# Patient Record
Sex: Male | Born: 1945 | Race: Black or African American | Hispanic: No | Marital: Single
Health system: Southern US, Community
[De-identification: ages and names within clinical notes are randomized; demographics above are authoritative.]

## PROBLEM LIST (undated history)

## (undated) DIAGNOSIS — N179 Acute kidney failure, unspecified: Secondary | ICD-10-CM

## (undated) DIAGNOSIS — J9621 Acute and chronic respiratory failure with hypoxia: Secondary | ICD-10-CM

## (undated) DIAGNOSIS — R6520 Severe sepsis without septic shock: Secondary | ICD-10-CM

## (undated) DIAGNOSIS — M4642 Discitis, unspecified, cervical region: Secondary | ICD-10-CM

## (undated) DIAGNOSIS — Z930 Tracheostomy status: Secondary | ICD-10-CM

## (undated) DIAGNOSIS — A419 Sepsis, unspecified organism: Secondary | ICD-10-CM

---

## 2011-10-28 DIAGNOSIS — E119 Type 2 diabetes mellitus without complications: Secondary | ICD-10-CM | POA: Insufficient documentation

## 2016-11-29 DIAGNOSIS — R918 Other nonspecific abnormal finding of lung field: Secondary | ICD-10-CM | POA: Insufficient documentation

## 2017-10-18 DIAGNOSIS — N400 Enlarged prostate without lower urinary tract symptoms: Secondary | ICD-10-CM | POA: Insufficient documentation

## 2019-02-19 DIAGNOSIS — N138 Other obstructive and reflux uropathy: Secondary | ICD-10-CM | POA: Insufficient documentation

## 2019-02-19 DIAGNOSIS — R296 Repeated falls: Secondary | ICD-10-CM | POA: Insufficient documentation

## 2019-02-19 DIAGNOSIS — N179 Acute kidney failure, unspecified: Secondary | ICD-10-CM | POA: Insufficient documentation

## 2019-02-19 DIAGNOSIS — D649 Anemia, unspecified: Secondary | ICD-10-CM | POA: Insufficient documentation

## 2019-02-19 DIAGNOSIS — R8281 Pyuria: Secondary | ICD-10-CM | POA: Insufficient documentation

## 2019-02-22 DIAGNOSIS — M4642 Discitis, unspecified, cervical region: Secondary | ICD-10-CM | POA: Diagnosis present

## 2019-02-26 DIAGNOSIS — J9602 Acute respiratory failure with hypercapnia: Secondary | ICD-10-CM | POA: Insufficient documentation

## 2019-02-26 DIAGNOSIS — J9601 Acute respiratory failure with hypoxia: Secondary | ICD-10-CM | POA: Insufficient documentation

## 2019-02-26 DIAGNOSIS — G9341 Metabolic encephalopathy: Secondary | ICD-10-CM | POA: Insufficient documentation

## 2019-03-04 DIAGNOSIS — J189 Pneumonia, unspecified organism: Secondary | ICD-10-CM | POA: Insufficient documentation

## 2019-03-09 DIAGNOSIS — N471 Phimosis: Secondary | ICD-10-CM | POA: Insufficient documentation

## 2019-03-09 DIAGNOSIS — F17210 Nicotine dependence, cigarettes, uncomplicated: Secondary | ICD-10-CM | POA: Insufficient documentation

## 2019-03-09 DIAGNOSIS — G8251 Quadriplegia, C1-C4 complete: Secondary | ICD-10-CM | POA: Insufficient documentation

## 2019-03-22 ENCOUNTER — Other Ambulatory Visit (HOSPITAL_COMMUNITY): Payer: Medicare HMO

## 2019-03-22 ENCOUNTER — Inpatient Hospital Stay
Admission: AD | Admit: 2019-03-22 | Discharge: 2019-04-10 | Disposition: A | Discharge status: Skilled Nursing Facility | Payer: Medicare HMO | Source: Other Acute Inpatient Hospital

## 2019-03-22 DIAGNOSIS — A419 Sepsis, unspecified organism: Secondary | ICD-10-CM | POA: Diagnosis present

## 2019-03-22 DIAGNOSIS — R509 Fever, unspecified: Secondary | ICD-10-CM

## 2019-03-22 DIAGNOSIS — M4642 Discitis, unspecified, cervical region: Secondary | ICD-10-CM | POA: Diagnosis present

## 2019-03-22 DIAGNOSIS — J9600 Acute respiratory failure, unspecified whether with hypoxia or hypercapnia: Secondary | ICD-10-CM

## 2019-03-22 DIAGNOSIS — N179 Acute kidney failure, unspecified: Secondary | ICD-10-CM | POA: Diagnosis present

## 2019-03-22 DIAGNOSIS — Z930 Tracheostomy status: Secondary | ICD-10-CM

## 2019-03-22 DIAGNOSIS — Z931 Gastrostomy status: Secondary | ICD-10-CM

## 2019-03-22 DIAGNOSIS — R6520 Severe sepsis without septic shock: Secondary | ICD-10-CM | POA: Diagnosis present

## 2019-03-22 DIAGNOSIS — J9621 Acute and chronic respiratory failure with hypoxia: Secondary | ICD-10-CM | POA: Diagnosis present

## 2019-03-22 HISTORY — DX: Acute and chronic respiratory failure with hypoxia: J96.21

## 2019-03-22 HISTORY — DX: Discitis, unspecified, cervical region: M46.42

## 2019-03-22 HISTORY — DX: Sepsis, unspecified organism: R65.20

## 2019-03-22 HISTORY — DX: Tracheostomy status: Z93.0

## 2019-03-22 HISTORY — DX: Sepsis, unspecified organism: A41.9

## 2019-03-22 HISTORY — DX: Acute kidney failure, unspecified: N17.9

## 2019-03-23 ENCOUNTER — Other Ambulatory Visit (HOSPITAL_COMMUNITY): Payer: Medicare HMO

## 2019-03-23 DIAGNOSIS — M4642 Discitis, unspecified, cervical region: Secondary | ICD-10-CM

## 2019-03-23 DIAGNOSIS — A419 Sepsis, unspecified organism: Secondary | ICD-10-CM

## 2019-03-23 DIAGNOSIS — J9621 Acute and chronic respiratory failure with hypoxia: Secondary | ICD-10-CM | POA: Diagnosis not present

## 2019-03-23 DIAGNOSIS — N179 Acute kidney failure, unspecified: Secondary | ICD-10-CM | POA: Diagnosis not present

## 2019-03-23 DIAGNOSIS — Z930 Tracheostomy status: Secondary | ICD-10-CM

## 2019-03-23 DIAGNOSIS — R6520 Severe sepsis without septic shock: Secondary | ICD-10-CM

## 2019-03-23 LAB — COMPREHENSIVE METABOLIC PANEL
ALT: 31 U/L (ref 0–44)
AST: 20 U/L (ref 15–41)
Albumin: 2.5 g/dL — ABNORMAL LOW (ref 3.5–5.0)
Alkaline Phosphatase: 65 U/L (ref 38–126)
Anion gap: 13 (ref 5–15)
BUN: 17 mg/dL (ref 8–23)
CO2: 26 mmol/L (ref 22–32)
Calcium: 9 mg/dL (ref 8.9–10.3)
Chloride: 98 mmol/L (ref 98–111)
Creatinine, Ser: 0.57 mg/dL — ABNORMAL LOW (ref 0.61–1.24)
GFR calc Af Amer: >60 mL/min (ref >60)
GFR calc non Af Amer: >60 mL/min (ref >60)
Glucose, Bld: 138 mg/dL — ABNORMAL HIGH (ref 70–99)
Potassium: 4.1 mmol/L (ref 3.5–5.1)
Sodium: 137 mmol/L (ref 135–145)
Total Bilirubin: 0.7 mg/dL (ref 0.3–1.2)
Total Protein: 6.2 g/dL — ABNORMAL LOW (ref 6.5–8.1)

## 2019-03-23 LAB — HEMOGLOBIN A1C
Hgb A1c MFr Bld: 6.1 % — ABNORMAL HIGH (ref 4.8–5.6)
Mean Plasma Glucose: 128.37 mg/dL

## 2019-03-23 LAB — CBC WITH DIFFERENTIAL/PLATELET
Abs Immature Granulocytes: 0.08 10*3/uL — ABNORMAL HIGH (ref 0.00–0.07)
Basophils Absolute: 0 10*3/uL (ref 0.0–0.1)
Basophils Relative: 0 %
Eosinophils Absolute: 0.7 10*3/uL — ABNORMAL HIGH (ref 0.0–0.5)
Eosinophils Relative: 9 %
HCT: 28.9 % — ABNORMAL LOW (ref 39.0–52.0)
Hemoglobin: 9.2 g/dL — ABNORMAL LOW (ref 13.0–17.0)
Immature Granulocytes: 1 %
Lymphocytes Relative: 14 %
Lymphs Abs: 1.1 10*3/uL (ref 0.7–4.0)
MCH: 23.8 pg — ABNORMAL LOW (ref 26.0–34.0)
MCHC: 31.8 g/dL (ref 30.0–36.0)
MCV: 74.9 fL — ABNORMAL LOW (ref 80.0–100.0)
Monocytes Absolute: 0.8 10*3/uL (ref 0.1–1.0)
Monocytes Relative: 9 %
Neutro Abs: 5.3 10*3/uL (ref 1.7–7.7)
Neutrophils Relative %: 67 %
Platelets: 288 10*3/uL (ref 150–400)
RBC: 3.86 MIL/uL — ABNORMAL LOW (ref 4.22–5.81)
RDW: 16.4 % — ABNORMAL HIGH (ref 11.5–15.5)
WBC: 8 10*3/uL (ref 4.0–10.5)
nRBC: 0 % (ref 0.0–0.2)

## 2019-03-23 LAB — MAGNESIUM: Magnesium: 1.8 mg/dL (ref 1.7–2.4)

## 2019-03-23 LAB — PROTIME-INR
INR: 1 (ref 0.8–1.2)
Prothrombin Time: 13.5 seconds (ref 11.4–15.2)

## 2019-03-23 LAB — PHOSPHORUS: Phosphorus: 4.5 mg/dL (ref 2.5–4.6)

## 2019-03-23 LAB — VANCOMYCIN, TROUGH
Vancomycin Tr: 27 ug/mL (ref 15–20)
Vancomycin Tr: 18 ug/mL (ref 15–20)

## 2019-03-23 MED ORDER — GENERIC EXTERNAL MEDICATION
Status: DC
Start: ? — End: 2019-03-23

## 2019-03-23 MED ORDER — SODIUM CHLORIDE 0.9 % IV SOLN
10.00 | INTRAVENOUS | Status: DC
Start: ? — End: 2019-03-23

## 2019-03-23 MED ORDER — CHLORHEXIDINE GLUCONATE 0.12 % MT SOLN
15.00 | OROMUCOSAL | Status: DC
Start: 2019-03-22 — End: 2019-03-23

## 2019-03-23 MED ORDER — DOCUSATE SODIUM 150 MG/15ML PO LIQD
100.00 | ORAL | Status: DC
Start: 2019-03-22 — End: 2019-03-23

## 2019-03-23 MED ORDER — IPRATROPIUM-ALBUTEROL 0.5-2.5 (3) MG/3ML IN SOLN
3.00 | RESPIRATORY_TRACT | Status: DC
Start: ? — End: 2019-03-23

## 2019-03-23 MED ORDER — FIRST-LANSOPRAZOLE 3 MG/ML PO SUSP
30.00 | ORAL | Status: DC
Start: 2019-03-23 — End: 2019-03-23

## 2019-03-23 MED ORDER — OXYCODONE HCL 5 MG PO TABS
5.00 | ORAL_TABLET | ORAL | Status: DC
Start: 2019-03-22 — End: 2019-03-23

## 2019-03-23 MED ORDER — GENERIC EXTERNAL MEDICATION
2.00 | Status: DC
Start: 2019-03-23 — End: 2019-03-23

## 2019-03-23 MED ORDER — GENERIC EXTERNAL MEDICATION
1.25 | Status: DC
Start: 2019-03-23 — End: 2019-03-23

## 2019-03-23 MED ORDER — SENNOSIDES 8.8 MG/5ML PO SYRP
10.00 | ORAL_SOLUTION | ORAL | Status: DC
Start: 2019-03-22 — End: 2019-03-23

## 2019-03-23 MED ORDER — CARBOXYMETHYLCELLULOSE SODIUM 1 % OP GEL
1.00 | OPHTHALMIC | Status: DC
Start: ? — End: 2019-03-23

## 2019-03-23 MED ORDER — HEPARIN SODIUM (PORCINE) 5000 UNIT/ML IJ SOLN
5000.00 | INTRAMUSCULAR | Status: DC
Start: 2019-03-22 — End: 2019-03-23

## 2019-03-23 MED ORDER — ONDANSETRON HCL 4 MG/2ML IJ SOLN
4.00 | INTRAMUSCULAR | Status: DC
Start: ? — End: 2019-03-23

## 2019-03-23 MED ORDER — TROPICAL LIQUID NUTRITION PO LIQD
5.00 | ORAL | Status: DC
Start: 2019-03-23 — End: 2019-03-23

## 2019-03-23 MED ORDER — MAGNESIUM OXIDE 400 MG PO TABS
800.00 | ORAL_TABLET | ORAL | Status: DC
Start: 2019-03-22 — End: 2019-03-23

## 2019-03-23 MED ORDER — MAGNESIUM HYDROXIDE 400 MG/5ML PO SUSP
15.00 | ORAL | Status: DC
Start: ? — End: 2019-03-23

## 2019-03-23 NOTE — Progress Notes (Signed)
Pulmonary [place]  Date of Service: 03/23/2019  PULMONARY CRITICAL CARE CONSULT   Jack Roberts  [id number]  DOB: 12-05-1945   DOA: 03/22/2019  Referring Physician: Merton Border, MD  HPI: Jack Roberts is a 74 y.o. male seen for follow up of Acute on Chronic Respiratory Failure.  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  Patient was apparently having some weakness going on for a week prior to admission he was having issues with falls and apparently fell on the day of admission.  In the ED he was noted to be afebrile and have mixed gram-positive flora in the urine.  He had a thorough neurological evaluation done CT cervical spine showed no acute abnormalities but the MRI that was done showed concern for discitis or osteomyelitis.  Patient also was noted to have other foci also that were noted at the time.  Patient was taken to the surgery had a C3-C4 laminectomies done cultures were collected at that time which were unremarkable.  Currently had a rapid response on January 31 with new onset of aphasia transferred to the ICU and actually was intubated at that time.  Review of Systems:  ROS performed and is unremarkable other than noted above.  Past Medical History:  Diagnosis Date  . Anemia  . COPD (chronic obstructive pulmonary disease) (*)  . Depression  . Diabetes mellitus (*)  Pre-Diabetes  . Essential hypertension  . Hypertension  . Obesity due to excess calories  . Prostate cancer ([place]) 10/27/2011  Sees urology at [facility] - no surgery, 2004  . PTSD (post-traumatic stress disorder)   Past Surgical History:  Procedure Laterality Date  . Posterior cervical laminectomy [id number]  [id number] Dr. Cathren Laine   No Known Allergies Infusions:  . fentaNYL 75 mcg/hr  (03/10/19 1312)  . NaCl Stopped (03/10/19 0912)    Medications: Reviewed on Rounds  Physical Exam:  Vitals: Temperature 98.7 pulse 107 respiratory rate 27 blood pressure is 122/69 saturations 100%  Ventilator Settings mode ventilation is spontaneous off ventilator on T collar currently on 28% FiO2  . General: Comfortable at this time . Eyes: Grossly normal lids, irises & conjunctiva . ENT: grossly tongue is normal . Neck: no obvious mass . Cardiovascular: S1-S2 normal no gallop or rub . Respiratory: No rhonchi no rales are noted . Abdomen: Soft and nontender at this time . Skin: no rash seen on limited exam . Musculoskeletal: not rigid . Psychiatric:unable to assess . Neurologic: no seizure no involuntary movements         Labs on Admission:  Basic Metabolic Panel: Recent Labs  Lab 03/23/19 0543  NA 137  K 4.1  CL 98  CO2 26  GLUCOSE 138*  BUN 17  CREATININE 0.57*  CALCIUM 9.0  MG 1.8  PHOS 4.5    No results for input(s): PHART, PCO2ART, PO2ART, HCO3, O2SAT in the last 168 hours.  Liver Function Tests: Recent Labs  Lab 03/23/19 0543  AST 20  ALT 31  ALKPHOS 65  BILITOT 0.7  PROT 6.2*  ALBUMIN 2.5*   No results for input(s): LIPASE, AMYLASE in the last 168 hours. No results for input(s): AMMONIA in the last 168 hours.  CBC: Recent Labs  Lab 03/23/19 0543  WBC 8.0  NEUTROABS 5.3  HGB 9.2*  HCT 28.9*  MCV 74.9*  PLT 288    Cardiac Enzymes: No  results for input(s): CKTOTAL, CKMB, CKMBINDEX, TROPONINI in the last 168 hours.  BNP (last 3 results) No results for input(s): BNP in the last 8760 hours.  ProBNP (last 3 results) No results for input(s): PROBNP in the last 8760 hours.   Radiological Exams on Admission: DG ABDOMEN PEG TUBE LOCATION  Result Date: 03/22/2019 CLINICAL DATA:  Peg tube positioning EXAM: ABDOMEN - 1 VIEW COMPARISON:  None. FINDINGS: Injection of contrast through the pre-existing PEG tube opacifies the stomach and the  proximal duodenum. The bowel gas pattern is nonobstructive. There is a large amount of stool in the colon. There is no obvious free air on this study. IMPRESSION: PEG tube in the stomach. Large amount of stool in the colon. No obvious free air on this study. Electronically Signed   By: Constance Holster M.D.   On: 03/22/2019 19:17   DG CHEST PORT 1 VIEW  Result Date: 03/23/2019 CLINICAL DATA:  Acute respiratory failure with tracheostomy EXAM: PORTABLE CHEST 1 VIEW COMPARISON:  None. FINDINGS: Tracheostomy tube is in place.  Left PICC with tip at the SVC. Low volume chest with hazy right and streaky left pulmonary opacity. No visible effusion. Normal heart size. IMPRESSION: 1. Unremarkable hardware positioning. 2. Low volume chest with hazy and streaky opacity attributed 2 atelectasis. Electronically Signed   By: Monte Fantasia M.D.   On: 03/23/2019 06:20    Assessment/Plan Active Problems:   Acute on chronic respiratory failure with hypoxia (HCC)   Acute renal failure (ARF) (HCC)   Discitis, unspecified, cervical region   Severe sepsis ([place])   Tracheostomy status ([place])   1. Acute on chronic respiratory failure with hypoxia patient right now is weaning on T collar has been on 28% FiO2 with good saturations.  Plan will be to continue with T collar trials continue secretion management pulmonary toilet. 2. Acute renal failure with hydronephrosis patient was treated for urinary tract infection and secondary to urinary retention.  We'll continue to follow along. 3. 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 4. Severe sepsis with shock treated patient has been on antibiotics infectious disease cultures will follow up with apparently the cultures were negative and there was some mild granulation tissue noted on the pathology at the time of surgery.  Patient is supposed to continue with  vancomycin and ceftriaxone for total of 6 weeks. 5. Tracheostomy status plan is to continue with full supportive care  I have personally seen and evaluated the patient, evaluated laboratory and imaging results, formulated the assessment and plan and placed orders. The Patient requires high complexity decision making with multiple systems involvement.  Case was discussed on Rounds with the Respiratory Therapy Director and the Respiratory staff Time Spent 4minutes  Jack Mcelroy A Inanna Telford, MD [facility] Pulmonary Critical Care Medicine Sleep Medicine

## 2019-03-24 DIAGNOSIS — Z930 Tracheostomy status: Secondary | ICD-10-CM | POA: Diagnosis not present

## 2019-03-24 DIAGNOSIS — A419 Sepsis, unspecified organism: Secondary | ICD-10-CM | POA: Diagnosis not present

## 2019-03-24 DIAGNOSIS — N179 Acute kidney failure, unspecified: Secondary | ICD-10-CM | POA: Diagnosis not present

## 2019-03-24 DIAGNOSIS — J9621 Acute and chronic respiratory failure with hypoxia: Secondary | ICD-10-CM | POA: Diagnosis not present

## 2019-03-24 NOTE — Progress Notes (Addendum)
Pulmonary Critical Care Medicine [place]   PULMONARY CRITICAL CARE SERVICE  PROGRESS NOTE  Date of Service: 03/24/2019  Jack Roberts  [id number]  DOB: 1945/12/21   DOA: 03/22/2019  Referring Physician: Merton Border, MD  HPI: Jack Roberts is a 74 y.o. male seen for follow up of Acute on Chronic Respiratory Failure.  Patient mains on 20% aerosol trach collar satting well no fever or distress.  Medications: Reviewed on Rounds  Physical Exam:  Vitals: Pulse 90 respirations 18 BP 151/75 O2 sat 100% temp 98.2  Ventilator Settings 20% aerosol trach collar  . General: Comfortable at this time . Eyes: Grossly normal lids, irises & conjunctiva . ENT: grossly tongue is normal . Neck: no obvious mass . Cardiovascular: S1 S2 normal no gallop . Respiratory: No rales or rhonchi noted . Abdomen: soft . Skin: no rash seen on limited exam . Musculoskeletal: not rigid . Psychiatric:unable to assess . Neurologic: no seizure no involuntary movements         Lab Data:   Basic Metabolic Panel: Recent Labs  Lab 03/23/19 0543  NA 137  K 4.1  CL 98  CO2 26  GLUCOSE 138*  BUN 17  CREATININE 0.57*  CALCIUM 9.0  MG 1.8  PHOS 4.5    ABG: No results for input(s): PHART, PCO2ART, PO2ART, HCO3, O2SAT in the last 168 hours.  Liver Function Tests: Recent Labs  Lab 03/23/19 0543  AST 20  ALT 31  ALKPHOS 65  BILITOT 0.7  PROT 6.2*  ALBUMIN 2.5*   No results for input(s): LIPASE, AMYLASE in the last 168 hours. No results for input(s): AMMONIA in the last 168 hours.  CBC: Recent Labs  Lab 03/23/19 0543  WBC 8.0  NEUTROABS 5.3  HGB 9.2*  HCT 28.9*  MCV 74.9*  PLT 288    Cardiac Enzymes: No results for input(s): CKTOTAL, CKMB, CKMBINDEX, TROPONINI in the last 168 hours.  BNP (last 3 results) No results for input(s): BNP in the last 8760 hours.  ProBNP (last 3 results) No results for input(s): PROBNP in the last 8760 hours.  Radiological  Exams: DG ABDOMEN PEG TUBE LOCATION  Result Date: 03/22/2019 CLINICAL DATA:  Peg tube positioning EXAM: ABDOMEN - 1 VIEW COMPARISON:  None. FINDINGS: Injection of contrast through the pre-existing PEG tube opacifies the stomach and the proximal duodenum. The bowel gas pattern is nonobstructive. There is a large amount of stool in the colon. There is no obvious free air on this study. IMPRESSION: PEG tube in the stomach. Large amount of stool in the colon. No obvious free air on this study. Electronically Signed   By: Constance Holster M.D.   On: 03/22/2019 19:17   DG CHEST PORT 1 VIEW  Result Date: 03/23/2019 CLINICAL DATA:  Acute respiratory failure with tracheostomy EXAM: PORTABLE CHEST 1 VIEW COMPARISON:  None. FINDINGS: Tracheostomy tube is in place.  Left PICC with tip at the SVC. Low volume chest with hazy right and streaky left pulmonary opacity. No visible effusion. Normal heart size. IMPRESSION: 1. Unremarkable hardware positioning. 2. Low volume chest with hazy and streaky opacity attributed 2 atelectasis. Electronically Signed   By: Monte Fantasia M.D.   On: 03/23/2019 06:20    Assessment/Plan Active Problems:   Acute on chronic respiratory failure with hypoxia (HCC)   Acute renal failure (ARF) (HCC)   Discitis, unspecified, cervical region   Severe sepsis ([place])   Tracheostomy status ([place])   1. Acute on chronic respiratory failure with  hypoxia patient is currently weaning on aerosol trach collar 28% FiO2.  We will continue to wean as tolerated continue supportive measures and pulmonary toilet. 2. Acute renal failure with hydronephrosis continue to follow along. 3. 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 4. Severe sepsis with shock treated with antibiotics per infectious disease cultures will follow.  Continue supportive measures. 5. Tracheostomy plan is to continue full supportive care.   I have personally seen and evaluated the patient,  evaluated laboratory and imaging results, formulated the assessment and plan and placed orders. The Patient requires high complexity decision making with multiple systems involvement.  Rounds were done with the Respiratory Therapy Director and Staff therapists and discussed with nursing staff also.  Allyne Gee, MD [facility] Pulmonary Critical Care Medicine Sleep Medicine

## 2019-03-25 DIAGNOSIS — N179 Acute kidney failure, unspecified: Secondary | ICD-10-CM | POA: Diagnosis not present

## 2019-03-25 DIAGNOSIS — A419 Sepsis, unspecified organism: Secondary | ICD-10-CM | POA: Diagnosis not present

## 2019-03-25 DIAGNOSIS — Z930 Tracheostomy status: Secondary | ICD-10-CM | POA: Diagnosis not present

## 2019-03-25 DIAGNOSIS — J9621 Acute and chronic respiratory failure with hypoxia: Secondary | ICD-10-CM | POA: Diagnosis not present

## 2019-03-25 LAB — VANCOMYCIN, TROUGH: Vancomycin Tr: 21 ug/mL (ref 15–20)

## 2019-03-25 LAB — PROTIME-INR
INR: 1 (ref 0.8–1.2)
Prothrombin Time: 13.1 seconds (ref 11.4–15.2)

## 2019-03-25 MED ORDER — GENERIC EXTERNAL MEDICATION
Status: DC
Start: ? — End: 2019-03-25

## 2019-03-25 NOTE — Progress Notes (Addendum)
Pulmonary Critical Care Medicine [place]   PULMONARY CRITICAL CARE SERVICE  PROGRESS NOTE  Date of Service: 03/25/2019  Claiborn Mccalip  [id number]  DOB: [birth year]   DOA: 03/22/2019  Referring Physician: Merton Border, MD  HPI: Jack Roberts is a 74 y.o. male seen for follow up of Acute on Chronic Respiratory Failure.  Patient mains on 20% aerosol trach collar using PMV without difficulty.  Satting well no fever distress.  Medications: Reviewed on Rounds  Physical Exam:  Vitals: Pulse 92 respirations 20 BP 112/60 O2 sat 99% temp 98.1  Ventilator Settings 20% ATC  . General: Comfortable at this time . Eyes: Grossly normal lids, irises & conjunctiva . ENT: grossly tongue is normal . Neck: no obvious mass . Cardiovascular: S1 S2 normal no gallop . Respiratory: No rales or rhonchi noted . Abdomen: soft . Skin: no rash seen on limited exam . Musculoskeletal: not rigid . Psychiatric:unable to assess . Neurologic: no seizure no involuntary movements         Lab Data:   Basic Metabolic Panel: Recent Labs  Lab 03/23/19 0543  NA 137  K 4.1  CL 98  CO2 26  GLUCOSE 138*  BUN 17  CREATININE 0.57*  CALCIUM 9.0  MG 1.8  PHOS 4.5    ABG: No results for input(s): PHART, PCO2ART, PO2ART, HCO3, O2SAT in the last 168 hours.  Liver Function Tests: Recent Labs  Lab 03/23/19 0543  AST 20  ALT 31  ALKPHOS 65  BILITOT 0.7  PROT 6.2*  ALBUMIN 2.5*   No results for input(s): LIPASE, AMYLASE in the last 168 hours. No results for input(s): AMMONIA in the last 168 hours.  CBC: Recent Labs  Lab 03/23/19 0543  WBC 8.0  NEUTROABS 5.3  HGB 9.2*  HCT 28.9*  MCV 74.9*  PLT 288    Cardiac Enzymes: No results for input(s): CKTOTAL, CKMB, CKMBINDEX, TROPONINI in the last 168 hours.  BNP (last 3 results) No results for input(s): BNP in the last 8760 hours.  ProBNP (last 3 results) No results for input(s): PROBNP in the last 8760  hours.  Radiological Exams: No results found.  Assessment/Plan Active Problems:   Acute on chronic respiratory failure with hypoxia (HCC)   Acute renal failure (ARF) (HCC)   Discitis, unspecified, cervical region   Severe sepsis ([place])   Tracheostomy status ([place])   1. Acute on chronic respiratory failure with hypoxia patient is currently weaning on aerosol trach collar 28% FiO2.  We will continue to wean as tolerated continue supportive measures and pulmonary toilet. 2. Acute renal failure with hydronephrosis continue to follow along. 3. 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 4. Severe sepsis with shock treated with antibiotics per infectious disease cultures will follow.  Continue supportive measures. 5. Tracheostomy plan is to continue full supportive care.   I have personally seen and evaluated the patient, evaluated laboratory and imaging results, formulated the assessment and plan and placed orders. The Patient requires high complexity decision making with multiple systems involvement.  Rounds were done with the Respiratory Therapy Director and Staff therapists and discussed with nursing staff also.  Allyne Gee, MD [facility] Pulmonary Critical Care Medicine Sleep Medicine

## 2019-03-26 ENCOUNTER — Other Ambulatory Visit (HOSPITAL_COMMUNITY): Payer: Medicare HMO

## 2019-03-26 DIAGNOSIS — A419 Sepsis, unspecified organism: Secondary | ICD-10-CM | POA: Diagnosis not present

## 2019-03-26 DIAGNOSIS — Z930 Tracheostomy status: Secondary | ICD-10-CM | POA: Diagnosis not present

## 2019-03-26 DIAGNOSIS — J9621 Acute and chronic respiratory failure with hypoxia: Secondary | ICD-10-CM | POA: Diagnosis not present

## 2019-03-26 DIAGNOSIS — N179 Acute kidney failure, unspecified: Secondary | ICD-10-CM | POA: Diagnosis not present

## 2019-03-26 MED ORDER — GENERIC EXTERNAL MEDICATION
Status: DC
Start: ? — End: 2019-03-26

## 2019-03-26 NOTE — Progress Notes (Signed)
Pulmonary Critical Care Medicine [place]   PULMONARY CRITICAL CARE SERVICE  PROGRESS NOTE  Date of Service: 03/26/2019  Jack Roberts  [id number]  DOB: 05/28/1945   DOA: 03/22/2019  Referring Physician: Merton Border, MD  HPI: Jack Roberts is a 74 y.o. male seen for follow up of Acute on Chronic Respiratory Failure.  Patient remains on T collar has been on 28% FiO2 using PMV also patient did have a fever noted today  Medications: Reviewed on Rounds  Physical Exam:  Vitals: Temperature is 99 pulse 102 respiratory rate 30 blood pressure is 123/73 saturations 100%  Ventilator Settings on T collar with an FiO2 of 28%  . General: Comfortable at this time . Eyes: Grossly normal lids, irises & conjunctiva . ENT: grossly tongue is normal . Neck: no obvious mass . Cardiovascular: S1 S2 normal no gallop . Respiratory: No rhonchi no rales are noted at this time . Abdomen: soft . Skin: no rash seen on limited exam . Musculoskeletal: not rigid . Psychiatric:unable to assess . Neurologic: no seizure no involuntary movements         Lab Data:   Basic Metabolic Panel: Recent Labs  Lab 03/23/19 0543  NA 137  K 4.1  CL 98  CO2 26  GLUCOSE 138*  BUN 17  CREATININE 0.57*  CALCIUM 9.0  MG 1.8  PHOS 4.5    ABG: No results for input(s): PHART, PCO2ART, PO2ART, HCO3, O2SAT in the last 168 hours.  Liver Function Tests: Recent Labs  Lab 03/23/19 0543  AST 20  ALT 31  ALKPHOS 65  BILITOT 0.7  PROT 6.2*  ALBUMIN 2.5*   No results for input(s): LIPASE, AMYLASE in the last 168 hours. No results for input(s): AMMONIA in the last 168 hours.  CBC: Recent Labs  Lab 03/23/19 0543  WBC 8.0  NEUTROABS 5.3  HGB 9.2*  HCT 28.9*  MCV 74.9*  PLT 288    Cardiac Enzymes: No results for input(s): CKTOTAL, CKMB, CKMBINDEX, TROPONINI in the last 168 hours.  BNP (last 3 results) No results for input(s): BNP in the last 8760 hours.  ProBNP (last 3  results) No results for input(s): PROBNP in the last 8760 hours.  Radiological Exams: DG Chest Port 1 View  Result Date: 03/26/2019 CLINICAL DATA:  Fever EXAM: PORTABLE CHEST 1 VIEW COMPARISON:  03/23/2019 chest radiograph FINDINGS: A tracheostomy tube and LEFT PICC line with tip overlying the mid SVC noted. RIGHT LOWER lung opacity/atelectasis is unchanged. LEFT mid lung atelectasis again noted. No pneumothorax or definite pleural effusion. No acute bony abnormalities are present. IMPRESSION: Unchanged RIGHT LOWER lung opacity/atelectasis and LEFT mid lung atelectasis. Electronically Signed   By: Margarette Canada M.D.   On: 03/26/2019 10:49    Assessment/Plan Active Problems:   Acute on chronic respiratory failure with hypoxia (HCC)   Acute renal failure (ARF) (HCC)   Discitis, unspecified, cervical region   Severe sepsis ([place])   Tracheostomy status ([place])   1. Acute on chronic respiratory failure hypoxia plan is to continue with T collar trials titrate oxygen continue pulmonary toilet 2. Acute renal failure at baseline following labs 3. Discitis patient does have infection which she was being treated for however now with temperature this will need to be reassessed. 4. Severe sepsis hemodynamics right now are stable patient does have fever will be treated 5. Tracheostomy remains in place tolerating PMV well   I have personally seen and evaluated the patient, evaluated laboratory and imaging results, formulated  the assessment and plan and placed orders. The Patient requires high complexity decision making with multiple systems involvement.  Rounds were done with the Respiratory Therapy Director and Staff therapists and discussed with nursing staff also.  Allyne Gee, MD [facility] Pulmonary Critical Care Medicine Sleep Medicine

## 2019-03-27 DIAGNOSIS — A419 Sepsis, unspecified organism: Secondary | ICD-10-CM | POA: Diagnosis not present

## 2019-03-27 DIAGNOSIS — Z930 Tracheostomy status: Secondary | ICD-10-CM | POA: Diagnosis not present

## 2019-03-27 DIAGNOSIS — J9621 Acute and chronic respiratory failure with hypoxia: Secondary | ICD-10-CM | POA: Diagnosis not present

## 2019-03-27 DIAGNOSIS — N179 Acute kidney failure, unspecified: Secondary | ICD-10-CM | POA: Diagnosis not present

## 2019-03-27 LAB — CBC
HCT: 32.6 % — ABNORMAL LOW (ref 39.0–52.0)
Hemoglobin: 9.9 g/dL — ABNORMAL LOW (ref 13.0–17.0)
MCH: 23.8 pg — ABNORMAL LOW (ref 26.0–34.0)
MCHC: 30.4 g/dL (ref 30.0–36.0)
MCV: 78.4 fL — ABNORMAL LOW (ref 80.0–100.0)
Platelets: 297 10*3/uL (ref 150–400)
RBC: 4.16 MIL/uL — ABNORMAL LOW (ref 4.22–5.81)
RDW: 16.5 % — ABNORMAL HIGH (ref 11.5–15.5)
WBC: 12.1 10*3/uL — ABNORMAL HIGH (ref 4.0–10.5)
nRBC: 0.2 % (ref 0.0–0.2)

## 2019-03-27 LAB — BASIC METABOLIC PANEL
Anion gap: 13 (ref 5–15)
BUN: 27 mg/dL — ABNORMAL HIGH (ref 8–23)
CO2: 26 mmol/L (ref 22–32)
Calcium: 8.9 mg/dL (ref 8.9–10.3)
Chloride: 97 mmol/L — ABNORMAL LOW (ref 98–111)
Creatinine, Ser: 1 mg/dL (ref 0.61–1.24)
GFR calc Af Amer: >60 mL/min (ref >60)
GFR calc non Af Amer: >60 mL/min (ref >60)
Glucose, Bld: 123 mg/dL — ABNORMAL HIGH (ref 70–99)
Potassium: 4.8 mmol/L (ref 3.5–5.1)
Sodium: 136 mmol/L (ref 135–145)

## 2019-03-27 LAB — URINE CULTURE: Culture: <10000 — AB

## 2019-03-27 LAB — VANCOMYCIN, TROUGH: Vancomycin Tr: 20 ug/mL (ref 15–20)

## 2019-03-27 LAB — MAGNESIUM: Magnesium: 2.2 mg/dL (ref 1.7–2.4)

## 2019-03-27 NOTE — Progress Notes (Signed)
Pulmonary Critical Care Medicine [place]   PULMONARY CRITICAL CARE SERVICE  PROGRESS NOTE  Date of Service: 03/27/2019  Aniseto Johnes  [id number]  DOB: 04/11/1945   DOA: 03/22/2019  Referring Physician: Merton Border, MD  HPI: Fiona Hogenson is a 74 y.o. male seen for follow up of Acute on Chronic Respiratory Failure.  Patient is on T collar on 28% FiO2 apparently still has a #8 tracheostomy in place  Medications: Reviewed on Rounds  Physical Exam:  Vitals: Temperature is 98.6 pulse 84 respiratory rate 18 blood pressure is 141/85 saturations 100%  Ventilator Settings on T collar FiO2 28%  . General: Comfortable at this time . Eyes: Grossly normal lids, irises & conjunctiva . ENT: grossly tongue is normal . Neck: no obvious mass . Cardiovascular: S1 S2 normal no gallop . Respiratory: No rhonchi no rales are noted . Abdomen: soft . Skin: no rash seen on limited exam . Musculoskeletal: not rigid . Psychiatric:unable to assess . Neurologic: no seizure no involuntary movements         Lab Data:   Basic Metabolic Panel: Recent Labs  Lab 03/23/19 0543 03/27/19 0421  NA 137 136  K 4.1 4.8  CL 98 97*  CO2 26 26  GLUCOSE 138* 123*  BUN 17 27*  CREATININE 0.57* 1.00  CALCIUM 9.0 8.9  MG 1.8 2.2  PHOS 4.5  --     ABG: No results for input(s): PHART, PCO2ART, PO2ART, HCO3, O2SAT in the last 168 hours.  Liver Function Tests: Recent Labs  Lab 03/23/19 0543  AST 20  ALT 31  ALKPHOS 65  BILITOT 0.7  PROT 6.2*  ALBUMIN 2.5*   No results for input(s): LIPASE, AMYLASE in the last 168 hours. No results for input(s): AMMONIA in the last 168 hours.  CBC: Recent Labs  Lab 03/23/19 0543 03/27/19 0421  WBC 8.0 12.1*  NEUTROABS 5.3  --   HGB 9.2* 9.9*  HCT 28.9* 32.6*  MCV 74.9* 78.4*  PLT 288 297    Cardiac Enzymes: No results for input(s): CKTOTAL, CKMB, CKMBINDEX, TROPONINI in the last 168 hours.  BNP (last 3 results) No results  for input(s): BNP in the last 8760 hours.  ProBNP (last 3 results) No results for input(s): PROBNP in the last 8760 hours.  Radiological Exams: DG Chest Port 1 View  Result Date: 03/26/2019 CLINICAL DATA:  Fever EXAM: PORTABLE CHEST 1 VIEW COMPARISON:  03/23/2019 chest radiograph FINDINGS: A tracheostomy tube and LEFT PICC line with tip overlying the mid SVC noted. RIGHT LOWER lung opacity/atelectasis is unchanged. LEFT mid lung atelectasis again noted. No pneumothorax or definite pleural effusion. No acute bony abnormalities are present. IMPRESSION: Unchanged RIGHT LOWER lung opacity/atelectasis and LEFT mid lung atelectasis. Electronically Signed   By: Margarette Canada M.D.   On: 03/26/2019 10:49    Assessment/Plan Active Problems:   Acute on chronic respiratory failure with hypoxia (HCC)   Acute renal failure (ARF) (HCC)   Discitis, unspecified, cervical region   Severe sepsis ([place])   Tracheostomy status ([place])   1. Acute on chronic respiratory failure with hypoxia plan is to continue with weaning protocol we will change the trach out to a #6 cuffless trach. 2. Acute renal failure continue to follow labs 3. Discitis treated 4. Severe sepsis resolving continue to monitor 5. Tracheostomy will be changed out today to a #6 cuffless   I have personally seen and evaluated the patient, evaluated laboratory and imaging results, formulated the assessment and plan  and placed orders. The Patient requires high complexity decision making with multiple systems involvement.  Rounds were done with the Respiratory Therapy Director and Staff therapists and discussed with nursing staff also.  Allyne Gee, MD [facility] Pulmonary Critical Care Medicine Sleep Medicine

## 2019-03-28 DIAGNOSIS — J9621 Acute and chronic respiratory failure with hypoxia: Secondary | ICD-10-CM | POA: Diagnosis not present

## 2019-03-28 DIAGNOSIS — Z930 Tracheostomy status: Secondary | ICD-10-CM | POA: Diagnosis not present

## 2019-03-28 DIAGNOSIS — A419 Sepsis, unspecified organism: Secondary | ICD-10-CM | POA: Diagnosis not present

## 2019-03-28 DIAGNOSIS — N179 Acute kidney failure, unspecified: Secondary | ICD-10-CM | POA: Diagnosis not present

## 2019-03-28 LAB — CULTURE, RESPIRATORY W GRAM STAIN: Culture: NORMAL

## 2019-03-28 MED ORDER — GENERIC EXTERNAL MEDICATION
Status: DC
Start: ? — End: 2019-03-28

## 2019-03-28 NOTE — Progress Notes (Signed)
Pulmonary Critical Care Medicine [place]   PULMONARY CRITICAL CARE SERVICE  PROGRESS NOTE  Date of Service: 03/28/2019  Jack Roberts  [id number]  DOB: 02/23/1945   DOA: 03/22/2019  Referring Physician: Merton Border, MD  HPI: Jack Roberts is a 74 y.o. male seen for follow up of Acute on Chronic Respiratory Failure.  Patient is on T collar currently on 28% FiO2 using PMV  Medications: Reviewed on Rounds  Physical Exam:  Vitals: Temperature is 99.0 pulse 98 respiratory rate 20 blood pressure is 112/65 saturations 97%  Ventilator Settings on T collar with an FiO2 of 28%  . General: Comfortable at this time . Eyes: Grossly normal lids, irises & conjunctiva . ENT: grossly tongue is normal . Neck: no obvious mass . Cardiovascular: S1 S2 normal no gallop . Respiratory: No rhonchi no rales are noted at this time . Abdomen: soft . Skin: no rash seen on limited exam . Musculoskeletal: not rigid . Psychiatric:unable to assess . Neurologic: no seizure no involuntary movements         Lab Data:   Basic Metabolic Panel: Recent Labs  Lab 03/23/19 0543 03/27/19 0421  NA 137 136  K 4.1 4.8  CL 98 97*  CO2 26 26  GLUCOSE 138* 123*  BUN 17 27*  CREATININE 0.57* 1.00  CALCIUM 9.0 8.9  MG 1.8 2.2  PHOS 4.5  --     ABG: No results for input(s): PHART, PCO2ART, PO2ART, HCO3, O2SAT in the last 168 hours.  Liver Function Tests: Recent Labs  Lab 03/23/19 0543  AST 20  ALT 31  ALKPHOS 65  BILITOT 0.7  PROT 6.2*  ALBUMIN 2.5*   No results for input(s): LIPASE, AMYLASE in the last 168 hours. No results for input(s): AMMONIA in the last 168 hours.  CBC: Recent Labs  Lab 03/23/19 0543 03/27/19 0421  WBC 8.0 12.1*  NEUTROABS 5.3  --   HGB 9.2* 9.9*  HCT 28.9* 32.6*  MCV 74.9* 78.4*  PLT 288 297    Cardiac Enzymes: No results for input(s): CKTOTAL, CKMB, CKMBINDEX, TROPONINI in the last 168 hours.  BNP (last 3 results) No results for  input(s): BNP in the last 8760 hours.  ProBNP (last 3 results) No results for input(s): PROBNP in the last 8760 hours.  Radiological Exams: DG Chest Port 1 View  Result Date: 03/26/2019 CLINICAL DATA:  Fever EXAM: PORTABLE CHEST 1 VIEW COMPARISON:  03/23/2019 chest radiograph FINDINGS: A tracheostomy tube and LEFT PICC line with tip overlying the mid SVC noted. RIGHT LOWER lung opacity/atelectasis is unchanged. LEFT mid lung atelectasis again noted. No pneumothorax or definite pleural effusion. No acute bony abnormalities are present. IMPRESSION: Unchanged RIGHT LOWER lung opacity/atelectasis and LEFT mid lung atelectasis. Electronically Signed   By: Margarette Canada M.D.   On: 03/26/2019 10:49    Assessment/Plan Active Problems:   Acute on chronic respiratory failure with hypoxia (HCC)   Acute renal failure (ARF) (HCC)   Discitis, unspecified, cervical region   Severe sepsis ([place])   Tracheostomy status ([place])   1. Acute on chronic respiratory failure with hypoxia continue with T collar trials patient is on 28% FiO2 last chest x-ray showing unchanged lower lobe atelectasis area 2. Acute renal failure no change 3. Discitis on antibiotics 4. Severe sepsis has been treated we will continue to follow 5. Tracheostomy remains in place right now   I have personally seen and evaluated the patient, evaluated laboratory and imaging results, formulated the assessment and  plan and placed orders. The Patient requires high complexity decision making with multiple systems involvement.  Rounds were done with the Respiratory Therapy Director and Staff therapists and discussed with nursing staff also.  Allyne Gee, MD [facility] Pulmonary Critical Care Medicine Sleep Medicine

## 2019-03-29 DIAGNOSIS — J9621 Acute and chronic respiratory failure with hypoxia: Secondary | ICD-10-CM | POA: Diagnosis not present

## 2019-03-29 DIAGNOSIS — A419 Sepsis, unspecified organism: Secondary | ICD-10-CM | POA: Diagnosis not present

## 2019-03-29 DIAGNOSIS — N179 Acute kidney failure, unspecified: Secondary | ICD-10-CM | POA: Diagnosis not present

## 2019-03-29 DIAGNOSIS — Z930 Tracheostomy status: Secondary | ICD-10-CM | POA: Diagnosis not present

## 2019-03-29 LAB — CBC
HCT: 28.5 % — ABNORMAL LOW (ref 39.0–52.0)
Hemoglobin: 8.6 g/dL — ABNORMAL LOW (ref 13.0–17.0)
MCH: 23.3 pg — ABNORMAL LOW (ref 26.0–34.0)
MCHC: 30.2 g/dL (ref 30.0–36.0)
MCV: 77.2 fL — ABNORMAL LOW (ref 80.0–100.0)
Platelets: 316 10*3/uL (ref 150–400)
RBC: 3.69 MIL/uL — ABNORMAL LOW (ref 4.22–5.81)
RDW: 15.9 % — ABNORMAL HIGH (ref 11.5–15.5)
WBC: 8.9 10*3/uL (ref 4.0–10.5)
nRBC: 0.2 % (ref 0.0–0.2)

## 2019-03-29 LAB — BASIC METABOLIC PANEL
Anion gap: 10 (ref 5–15)
BUN: 27 mg/dL — ABNORMAL HIGH (ref 8–23)
CO2: 30 mmol/L (ref 22–32)
Calcium: 8.8 mg/dL — ABNORMAL LOW (ref 8.9–10.3)
Chloride: 98 mmol/L (ref 98–111)
Creatinine, Ser: 1 mg/dL (ref 0.61–1.24)
GFR calc Af Amer: >60 mL/min (ref >60)
GFR calc non Af Amer: >60 mL/min (ref >60)
Glucose, Bld: 147 mg/dL — ABNORMAL HIGH (ref 70–99)
Potassium: 4.4 mmol/L (ref 3.5–5.1)
Sodium: 138 mmol/L (ref 135–145)

## 2019-03-29 LAB — MAGNESIUM: Magnesium: 2.4 mg/dL (ref 1.7–2.4)

## 2019-03-29 NOTE — Progress Notes (Signed)
Pulmonary Critical Care Medicine [place]   PULMONARY CRITICAL CARE SERVICE  PROGRESS NOTE  Date of Service: 03/29/2019  Jack Roberts  [id number]  DOB: 02/19/1945   DOA: 03/22/2019  Referring Physician: Merton Border, MD  HPI: Jack Roberts is a 74 y.o. male seen for follow up of Acute on Chronic Respiratory Failure.  Patient is on T collar now on 28% FiO2 with good saturations  Medications: Reviewed on Rounds  Physical Exam:  Vitals: Temperature 99.1 pulse 96 respiratory 20 blood pressure is 118/76 saturation 94%  Ventilator Settings off ventilator on T collar FiO2 28%  . General: Comfortable at this time . Eyes: Grossly normal lids, irises & conjunctiva . ENT: grossly tongue is normal . Neck: no obvious mass . Cardiovascular: S1 S2 normal no gallop . Respiratory: No rhonchi no rales are noted at this time . Abdomen: soft . Skin: no rash seen on limited exam . Musculoskeletal: not rigid . Psychiatric:unable to assess . Neurologic: no seizure no involuntary movements         Lab Data:   Basic Metabolic Panel: Recent Labs  Lab 03/23/19 0543 03/27/19 0421 03/29/19 0936  NA 137 136 138  K 4.1 4.8 4.4  CL 98 97* 98  CO2 26 26 30   GLUCOSE 138* 123* 147*  BUN 17 27* 27*  CREATININE 0.57* 1.00 1.00  CALCIUM 9.0 8.9 8.8*  MG 1.8 2.2 2.4  PHOS 4.5  --   --     ABG: No results for input(s): PHART, PCO2ART, PO2ART, HCO3, O2SAT in the last 168 hours.  Liver Function Tests: Recent Labs  Lab 03/23/19 0543  AST 20  ALT 31  ALKPHOS 65  BILITOT 0.7  PROT 6.2*  ALBUMIN 2.5*   No results for input(s): LIPASE, AMYLASE in the last 168 hours. No results for input(s): AMMONIA in the last 168 hours.  CBC: Recent Labs  Lab 03/23/19 0543 03/27/19 0421 03/29/19 0936  WBC 8.0 12.1* 8.9  NEUTROABS 5.3  --   --   HGB 9.2* 9.9* 8.6*  HCT 28.9* 32.6* 28.5*  MCV 74.9* 78.4* 77.2*  PLT 288 297 316    Cardiac Enzymes: No results for  input(s): CKTOTAL, CKMB, CKMBINDEX, TROPONINI in the last 168 hours.  BNP (last 3 results) No results for input(s): BNP in the last 8760 hours.  ProBNP (last 3 results) No results for input(s): PROBNP in the last 8760 hours.  Radiological Exams: No results found.  Assessment/Plan Active Problems:   Acute on chronic respiratory failure with hypoxia (HCC)   Acute renal failure (ARF) (HCC)   Discitis, unspecified, cervical region   Severe sepsis ([employer])   Tracheostomy status ([place])   1. Acute on chronic respiratory failure hypoxia on 28% FiO2 good saturations will try PMV 2. Acute renal failure following labs 3. Discitis treated antibiotics 4. Severe sepsis hemodynamics are stable 5. Tracheostomy remains in place   I have personally seen and evaluated the patient, evaluated laboratory and imaging results, formulated the assessment and plan and placed orders. The Patient requires high complexity decision making with multiple systems involvement.  Rounds were done with the Respiratory Therapy Director and Staff therapists and discussed with nursing staff also.  Allyne Gee, MD [facility] Pulmonary Critical Care Medicine Sleep Medicine

## 2019-03-30 DIAGNOSIS — N179 Acute kidney failure, unspecified: Secondary | ICD-10-CM | POA: Diagnosis not present

## 2019-03-30 DIAGNOSIS — J9621 Acute and chronic respiratory failure with hypoxia: Secondary | ICD-10-CM | POA: Diagnosis not present

## 2019-03-30 DIAGNOSIS — A419 Sepsis, unspecified organism: Secondary | ICD-10-CM | POA: Diagnosis not present

## 2019-03-30 DIAGNOSIS — Z930 Tracheostomy status: Secondary | ICD-10-CM | POA: Diagnosis not present

## 2019-03-30 MED ORDER — GENERIC EXTERNAL MEDICATION
Status: DC
Start: ? — End: 2019-03-30

## 2019-03-30 NOTE — Progress Notes (Signed)
Pulmonary Critical Care Medicine [place]   PULMONARY CRITICAL CARE SERVICE  PROGRESS NOTE  Date of Service: 03/30/2019  Jack Roberts  [id number]  DOB: [birth year]   DOA: 03/22/2019  Referring Physician: Merton Border, MD  HPI: Jack Roberts is a 74 y.o. male seen for follow up of Acute on Chronic Respiratory Failure.  Patient is on T collar now on 28% FiO2 using the PMV without any distress  Medications: Reviewed on Rounds  Physical Exam:  Vitals: Temperature is 98.1 pulse 83 respiratory 21 blood pressure is 149/66 saturations 98%  Ventilator Settings patient is on T collar currently on 28% FiO2  . General: Comfortable at this time . Eyes: Grossly normal lids, irises & conjunctiva . ENT: grossly tongue is normal . Neck: no obvious mass . Cardiovascular: S1 S2 normal no gallop . Respiratory: No rhonchi coarse breath sounds . Abdomen: soft . Skin: no rash seen on limited exam . Musculoskeletal: not rigid . Psychiatric:unable to assess . Neurologic: no seizure no involuntary movements         Lab Data:   Basic Metabolic Panel: Recent Labs  Lab 03/27/19 0421 03/29/19 0936  NA 136 138  K 4.8 4.4  CL 97* 98  CO2 26 30  GLUCOSE 123* 147*  BUN 27* 27*  CREATININE 1.00 1.00  CALCIUM 8.9 8.8*  MG 2.2 2.4    ABG: No results for input(s): PHART, PCO2ART, PO2ART, HCO3, O2SAT in the last 168 hours.  Liver Function Tests: No results for input(s): AST, ALT, ALKPHOS, BILITOT, PROT, ALBUMIN in the last 168 hours. No results for input(s): LIPASE, AMYLASE in the last 168 hours. No results for input(s): AMMONIA in the last 168 hours.  CBC: Recent Labs  Lab 03/27/19 0421 03/29/19 0936  WBC 12.1* 8.9  HGB 9.9* 8.6*  HCT 32.6* 28.5*  MCV 78.4* 77.2*  PLT 297 316    Cardiac Enzymes: No results for input(s): CKTOTAL, CKMB, CKMBINDEX, TROPONINI in the last 168 hours.  BNP (last 3 results) No results for input(s): BNP in the last 8760  hours.  ProBNP (last 3 results) No results for input(s): PROBNP in the last 8760 hours.  Radiological Exams: No results found.  Assessment/Plan Active Problems:   Acute on chronic respiratory failure with hypoxia (HCC)   Acute renal failure (ARF) (HCC)   Discitis, unspecified, cervical region   Severe sepsis ([place])   Tracheostomy status ([place])   1. Acute on chronic respiratory failure with hypoxia patient currently will be weaned on T collar 2. Use the PMV reportedly secretions are still an issue 3. Acute renal failure monitoring labs 4. Discitis treated antibiotics 5. Severe sepsis we will continue supportive care 6. Tracheostomy remains in place   I have personally seen and evaluated the patient, evaluated laboratory and imaging results, formulated the assessment and plan and placed orders. The Patient requires high complexity decision making with multiple systems involvement.  Rounds were done with the Respiratory Therapy Director and Staff therapists and discussed with nursing staff also.  Allyne Gee, MD [facility] Pulmonary Critical Care Medicine Sleep Medicine

## 2019-03-30 NOTE — Consult Note (Signed)
Infectious Disease Consultation   Danelle Sandstrom  [id number]  DOB: 1945-04-24  DOA: 03/22/2019  Requesting physician: Dr.Hijazi  Reason for consultation: Antibiotic recommendations   History of Present Illness: Jack Roberts is an 74 y.o. male with multiple medical problems, COPD, chronic respiratory failure, tracheostomy dependent, hypertension, diabetes mellitus, depression/PTSD who was apparently found down and admitted at the acute facility for generalized weakness.  He had an MRI and was diagnosed with discitis/osteomyelitis along with urinary tract infection.  Per infectious disease note from the outside facility UA showed greater than 100 WBC with culture mixed gram-positive flora.  CT scan showed bilateral hydroureteronephrosis without obstructing stone or mass likely secondary to bladder distention.  His Covid testing was negative.  Blood cultures did not show any growth. patient hospital course was complicated by worsening respiratory failure and required intubation and ventilation and eventually tracheostomy and PEG tube placement. Per records from acute facility MRI of the spine showed findings concerning for discitis/osteomyelitis at C3-4 with severe narrowing of the thecal sac and concern for cord compression with likely epidural phlegmon-neoplasm also on the differential. There was another level of increased focus nondiagnostic due to motion at T9-T10 with follow-up recommended if clinically indicated. 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 He underwent laminectomies at C3/4 for epidural mass considered to be phlegmon.  He received treatment with vancomycin, ceftriaxone, cefepime, meropenem, Flagyl. Intraoperative cultures reportedly did not show any growth likely because he was on antibiotics prior to surgery. He was switched to IV vancomycin, ceftriaxone per infectious disease recommendations at the outside facility.  Plan to  treat for a total duration of 6 weeks.  Review of Systems:  He is lethargic but arousable, moaning and groaning but nonverbal at this time.  Unable to obtain review of systems.   Past Medical History: Past Medical History:  Diagnosis Date  . Acute on chronic respiratory failure with hypoxia ([place])   . Acute renal failure (ARF) ([place])   . Discitis, unspecified, cervical region   . Severe sepsis ([place])   . Tracheostomy status (HCC)   COPD, hypertension, diabetes mellitus  Past Surgical History: Unable to obtain at this time  Allergies: No known drug allergies  Social History: Smoking status: Current Every Day Smoker  Packs/day: 0.50  Years: 20.00  Pack years: 10.00  . Smokeless tobacco: Never Used  Substance and Sexual Activity  . Alcohol use: Not Currently  . Drug use: No    Family History: . Hypertension Mother  . Depression Mother  . Hypertension Father  . Prostate cancer Neg Hx     Physical Exam: Temperature 98.9, T-max 100, pulse 103, respiratory 23, blood pressure 144/76, pulse oximetry 95% Constitutional: Ill-appearing male, lethargic but arousable, moaning and groaning Eyes: PERLA ENMT: external ears and nose appear normal, normal hearing, moist oral mucosa             Neck: Has trach in place CVS: S1-S2 Respiratory: Decreased breath sound lower lobes, scattered rhonchi, no wheezing Abdomen: soft nontender, positive bowel sounds Musculoskeletal: Quadriplegia, lower extremity edema Neuro: Quadriplegia Psych: Lethargic but arousable.  Unable to assess at this time Skin: No new rashes  Data reviewed:  I have personally reviewed following labs and imaging studies Labs:  CBC: Recent Labs  Lab 03/27/19 0421 03/29/19 0936  WBC 12.1* 8.9  HGB 9.9* 8.6*  HCT 32.6* 28.5*  MCV 78.4* 77.2*  PLT 297 316  Basic Metabolic Panel: Recent Labs  Lab 03/27/19 0421 03/29/19 0936  NA 136 138  K 4.8 4.4  CL 97* 98  CO2 26 30  GLUCOSE 123* 147*  BUN 27* 27*   CREATININE 1.00 1.00  CALCIUM 8.9 8.8*  MG 2.2 2.4   GFR CrCl cannot be calculated (Unknown ideal weight.). Liver Function Tests: No results for input(s): AST, ALT, ALKPHOS, BILITOT, PROT, ALBUMIN in the last 168 hours. No results for input(s): LIPASE, AMYLASE in the last 168 hours. No results for input(s): AMMONIA in the last 168 hours. Coagulation profile Recent Labs  Lab 03/25/19 2132  INR 1.0    Cardiac Enzymes: No results for input(s): CKTOTAL, CKMB, CKMBINDEX, TROPONINI in the last 168 hours. BNP: Invalid input(s): POCBNP CBG: No results for input(s): GLUCAP in the last 168 hours. D-Dimer No results for input(s): DDIMER in the last 72 hours. Hgb A1c No results for input(s): HGBA1C in the last 72 hours. Lipid Profile No results for input(s): CHOL, HDL, LDLCALC, TRIG, CHOLHDL, LDLDIRECT in the last 72 hours. Thyroid function studies No results for input(s): TSH, T4TOTAL, T3FREE, THYROIDAB in the last 72 hours.  Invalid input(s): FREET3 Anemia work up No results for input(s): VITAMINB12, FOLATE, FERRITIN, TIBC, IRON, RETICCTPCT in the last 72 hours. Urinalysis No results found for: COLORURINE, APPEARANCEUR, [place], [place], [place], [place], [place], [place], [place], UROBILINOGEN, NITRITE, [place]   Microbiology Recent Results (from the past 240 hour(s))  Culture, respiratory (non-expectorated)     Status: None   Collection Time: 03/26/19  9:48 AM   Specimen: Tracheal Aspirate; Respiratory  Result Value Ref Range Status   Specimen Description TRACHEAL ASPIRATE  Final   Special Requests NONE  Final   Gram Stain   Final    FEW WBC PRESENT, PREDOMINANTLY PMN NO ORGANISMS SEEN    Culture   Final    Consistent with normal respiratory flora. Performed at [facility], [place] [address], [place], [place] [address]    Report Status 03/28/2019 FINAL  Final  Culture, Urine     Status: Abnormal   Collection Time: 03/26/19  6:30 PM   Specimen:  Urine, Random  Result Value Ref Range Status   Specimen Description URINE, RANDOM  Final   Special Requests NONE  Final   Culture (A)  Final    <10,000 COLONIES/mL INSIGNIFICANT GROWTH Performed at [facility] [address], [place], [place] [address]    Report Status 03/27/2019 FINAL  Final       Inpatient Medications:   Please see MAR   Radiological Exams on Admission: No results found.  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  He was followed by pulmonary.  Patient is a longtime smoker and likely has underlying COPD.  Currently he is trach dependent.  Due to his chronic trach is high risk for recurrent tracheobronchitis, recurrent pneumonia.  If his rates are status worsens would recommend to repeat chest imaging and send for respiratory cultures.  Currently on treatment with IV vancomycin, ceftriaxone for the C3-C4 discitis/osteomyelitis.  C3-C4 discitis/osteomyelitis: Per MRI at the outside facility he had a fluid collection with severe cord compression.  He  is status post laminectomy and decompression.  Intraoperative cultures did not show any growth likely because he was already on antibiotics prior to the surgery.  Currently on treatment with IV vancomycin, ceftriaxone.  Plan to treat for total duration of 6 weeks with tentative end date 04/19/2019.  Please monitor BUN/trending closely while on antibiotics and adjust dose accordingly.  Severe sepsis: Likely secondary to urinary tract infection along  with C3-C4 discitis/osteomyelitis.  Currently sepsis resolved.  However, he is at high risk for recurrent sepsis.  If he starts having any worsening fevers or worsening WBC count would recommend to send for pan cultures.  Central cord compression: The status post laminectomy and decompression.  Unfortunately quadriplegic.  Continue supportive management per primary team.  UTI: Patient reportedly had urinary retention with bilateral hydronephrosis likely secondary to bladder distention without any obstruction.  His UA showed evidence of UTI but urine culture showed mixed flora.  Already treated with antibiotics at the acute facility.  Currently on antibiotics as mentioned above for the C3-C4 discitis/osteomyelitis.  Unfortunately he is high risk for recurrent urinary tract infections.  Protein calorie malnutrition/dysphagia: Due to his dysphagia is high risk for aspiration and worsening respiratory failure secondary to aspiration pneumonia.  Further management per primary team.  Diabetes mellitus type 2: Continue to monitor Accu-Cheks, management of diabetes per the primary team.  Acute renal failure with hydronephrosis: Per records from the outside facility there was no evidence of obstruction.  The hydronephrosis was likely secondary to bladder distention.  Please monitor BUN/trending closely while on the antibiotics.  Further management per the primary team.  Unfortunately due to his complex medical problems he is high risk for worsening and decompensation.   Plan of care discussed with the primary team.  Thank you for this consultation.    Yaakov Guthrie M.D. 03/30/2019, 4:43 PM

## 2019-03-31 DIAGNOSIS — A419 Sepsis, unspecified organism: Secondary | ICD-10-CM | POA: Diagnosis not present

## 2019-03-31 DIAGNOSIS — Z930 Tracheostomy status: Secondary | ICD-10-CM | POA: Diagnosis not present

## 2019-03-31 DIAGNOSIS — J9621 Acute and chronic respiratory failure with hypoxia: Secondary | ICD-10-CM | POA: Diagnosis not present

## 2019-03-31 DIAGNOSIS — N179 Acute kidney failure, unspecified: Secondary | ICD-10-CM | POA: Diagnosis not present

## 2019-03-31 LAB — VANCOMYCIN, TROUGH: Vancomycin Tr: 19 ug/mL (ref 15–20)

## 2019-03-31 NOTE — Progress Notes (Addendum)
Pulmonary Critical Care Medicine [place]   PULMONARY CRITICAL CARE SERVICE  PROGRESS NOTE  Date of Service: 03/31/2019  Blaine Mccalla  [id number]  DOB: [birth year]   DOA: 03/22/2019  Referring Physician: Merton Border, MD  HPI: Emmitte Spindel is a 74 y.o. male seen for follow up of Acute on Chronic Respiratory Failure.  Patient mains capped on 1 L at this time satting well no fever or distress.  Medications: Reviewed on Rounds  Physical Exam:  Vitals: Pulse 80 respirations 20 BP 115/60 O2 sat 99% temp 98.5  Ventilator Settings 1 L nasal cannula  . General: Comfortable at this time . Eyes: Grossly normal lids, irises & conjunctiva . ENT: grossly tongue is normal . Neck: no obvious mass . Cardiovascular: S1 S2 normal no gallop . Respiratory: No rales or rhonchi noted . Abdomen: soft . Skin: no rash seen on limited exam . Musculoskeletal: not rigid . Psychiatric:unable to assess . Neurologic: no seizure no involuntary movements         Lab Data:   Basic Metabolic Panel: Recent Labs  Lab 03/27/19 0421 03/29/19 0936  NA 136 138  K 4.8 4.4  CL 97* 98  CO2 26 30  GLUCOSE 123* 147*  BUN 27* 27*  CREATININE 1.00 1.00  CALCIUM 8.9 8.8*  MG 2.2 2.4    ABG: No results for input(s): PHART, PCO2ART, PO2ART, HCO3, O2SAT in the last 168 hours.  Liver Function Tests: No results for input(s): AST, ALT, ALKPHOS, BILITOT, PROT, ALBUMIN in the last 168 hours. No results for input(s): LIPASE, AMYLASE in the last 168 hours. No results for input(s): AMMONIA in the last 168 hours.  CBC: Recent Labs  Lab 03/27/19 0421 03/29/19 0936  WBC 12.1* 8.9  HGB 9.9* 8.6*  HCT 32.6* 28.5*  MCV 78.4* 77.2*  PLT 297 316    Cardiac Enzymes: No results for input(s): CKTOTAL, CKMB, CKMBINDEX, TROPONINI in the last 168 hours.  BNP (last 3 results) No results for input(s): BNP in the last 8760 hours.  ProBNP (last 3 results) No results for input(s): PROBNP in  the last 8760 hours.  Radiological Exams: No results found.  Assessment/Plan Active Problems:   Acute on chronic respiratory failure with hypoxia (HCC)   Acute renal failure (ARF) (HCC)   Discitis, unspecified, cervical region   Severe sepsis ([place])   Tracheostomy status ([place])   1. Acute on chronic respiratory failure with hypoxia patient currently weaning on 1 L nasal cannula and capped.  We will continue supportive measures and pulmonary toilet. 2. Use the PMV reportedly secretions are still an issue 3. Acute renal failure monitoring labs 4. Discitis treated antibiotics 5. Severe sepsis we will continue supportive care 6. Tracheostomy remains in place   I have personally seen and evaluated the patient, evaluated laboratory and imaging results, formulated the assessment and plan and placed orders. The Patient requires high complexity decision making with multiple systems involvement.  Rounds were done with the Respiratory Therapy Director and Staff therapists and discussed with nursing staff also.  Allyne Gee, MD [facility] Pulmonary Critical Care Medicine Sleep Medicine

## 2019-04-01 DIAGNOSIS — Z930 Tracheostomy status: Secondary | ICD-10-CM | POA: Diagnosis not present

## 2019-04-01 DIAGNOSIS — N179 Acute kidney failure, unspecified: Secondary | ICD-10-CM | POA: Diagnosis not present

## 2019-04-01 DIAGNOSIS — J9621 Acute and chronic respiratory failure with hypoxia: Secondary | ICD-10-CM | POA: Diagnosis not present

## 2019-04-01 DIAGNOSIS — A419 Sepsis, unspecified organism: Secondary | ICD-10-CM | POA: Diagnosis not present

## 2019-04-01 MED ORDER — GENERIC EXTERNAL MEDICATION
Status: DC
Start: ? — End: 2019-04-01

## 2019-04-01 NOTE — Progress Notes (Addendum)
Pulmonary Critical Care Medicine [place]   PULMONARY CRITICAL CARE SERVICE  PROGRESS NOTE  Date of Service: 04/01/2019  Jack Roberts  [id number]  DOB: 09-25-1945   DOA: 03/22/2019  Referring Physician: Merton Border, MD  HPI: Jack Roberts is a 74 y.o. male seen for follow up of Acute on Chronic Respiratory Failure. Pt remains on 28% ATC capped at this time. No fever or distress noted.   Medications: Reviewed on Rounds  Physical Exam:  Vitals: Pulse 82, REsp 18, BP 135/73, o2 sat 98%, temp 97.1  Ventilator Settings 28% ATC  . General: Comfortable at this time . Eyes: Grossly normal lids, irises & conjunctiva . ENT: grossly tongue is normal . Neck: no obvious mass . Cardiovascular: S1 S2 normal no gallop . Respiratory: no rales or ronchi noted . Abdomen: soft . Skin: no rash seen on limited exam . Musculoskeletal: not rigid . Psychiatric:unable to assess . Neurologic: no seizure no involuntary movements         Lab Data:   Basic Metabolic Panel: Recent Labs  Lab 03/27/19 0421 03/29/19 0936  NA 136 138  K 4.8 4.4  CL 97* 98  CO2 26 30  GLUCOSE 123* 147*  BUN 27* 27*  CREATININE 1.00 1.00  CALCIUM 8.9 8.8*  MG 2.2 2.4    ABG: No results for input(s): PHART, PCO2ART, PO2ART, HCO3, O2SAT in the last 168 hours.  Liver Function Tests: No results for input(s): AST, ALT, ALKPHOS, BILITOT, PROT, ALBUMIN in the last 168 hours. No results for input(s): LIPASE, AMYLASE in the last 168 hours. No results for input(s): AMMONIA in the last 168 hours.  CBC: Recent Labs  Lab 03/27/19 0421 03/29/19 0936  WBC 12.1* 8.9  HGB 9.9* 8.6*  HCT 32.6* 28.5*  MCV 78.4* 77.2*  PLT 297 316    Cardiac Enzymes: No results for input(s): CKTOTAL, CKMB, CKMBINDEX, TROPONINI in the last 168 hours.  BNP (last 3 results) No results for input(s): BNP in the last 8760 hours.  ProBNP (last 3 results) No results for input(s): PROBNP in the last 8760  hours.  Radiological Exams: No results found.  Assessment/Plan Active Problems:   Acute on chronic respiratory failure with hypoxia (HCC)   Acute renal failure (ARF) (HCC)   Discitis, unspecified, cervical region   Severe sepsis ([place])   Tracheostomy status ([place])   1. Acute on chronic respiratory failure with hypoxia patient will begin capping today.  2. Use the PMV reportedly secretions are still an issue 3. Acute renal failure monitoring labs 4. Discitis treated antibiotics 5. Severe sepsis we will continue supportive care 6. Tracheostomy remains in place   I have personally seen and evaluated the patient, evaluated laboratory and imaging results, formulated the assessment and plan and placed orders. The Patient requires high complexity decision making with multiple systems involvement.  Rounds were done with the Respiratory Therapy Director and Staff therapists and discussed with nursing staff also.  Allyne Gee, MD [facility] Pulmonary Critical Care Medicine Sleep Medicine

## 2019-04-02 DIAGNOSIS — A419 Sepsis, unspecified organism: Secondary | ICD-10-CM | POA: Diagnosis not present

## 2019-04-02 DIAGNOSIS — Z930 Tracheostomy status: Secondary | ICD-10-CM | POA: Diagnosis not present

## 2019-04-02 DIAGNOSIS — N179 Acute kidney failure, unspecified: Secondary | ICD-10-CM | POA: Diagnosis not present

## 2019-04-02 DIAGNOSIS — J9621 Acute and chronic respiratory failure with hypoxia: Secondary | ICD-10-CM | POA: Diagnosis not present

## 2019-04-02 LAB — BASIC METABOLIC PANEL
Anion gap: 8 (ref 5–15)
BUN: 17 mg/dL (ref 8–23)
CO2: 28 mmol/L (ref 22–32)
Calcium: 8.7 mg/dL — ABNORMAL LOW (ref 8.9–10.3)
Chloride: 106 mmol/L (ref 98–111)
Creatinine, Ser: 0.83 mg/dL (ref 0.61–1.24)
GFR calc Af Amer: >60 mL/min (ref >60)
GFR calc non Af Amer: >60 mL/min (ref >60)
Glucose, Bld: 147 mg/dL — ABNORMAL HIGH (ref 70–99)
Potassium: 3.8 mmol/L (ref 3.5–5.1)
Sodium: 142 mmol/L (ref 135–145)

## 2019-04-02 LAB — MAGNESIUM: Magnesium: 1.8 mg/dL (ref 1.7–2.4)

## 2019-04-02 LAB — CBC
HCT: 29.2 % — ABNORMAL LOW (ref 39.0–52.0)
Hemoglobin: 8.8 g/dL — ABNORMAL LOW (ref 13.0–17.0)
MCH: 23.3 pg — ABNORMAL LOW (ref 26.0–34.0)
MCHC: 30.1 g/dL (ref 30.0–36.0)
MCV: 77.5 fL — ABNORMAL LOW (ref 80.0–100.0)
Platelets: 315 10*3/uL (ref 150–400)
RBC: 3.77 MIL/uL — ABNORMAL LOW (ref 4.22–5.81)
RDW: 16.1 % — ABNORMAL HIGH (ref 11.5–15.5)
WBC: 9.7 10*3/uL (ref 4.0–10.5)
nRBC: 0 % (ref 0.0–0.2)

## 2019-04-02 NOTE — Progress Notes (Addendum)
Pulmonary Critical Care Medicine [place]   PULMONARY CRITICAL CARE SERVICE  PROGRESS NOTE  Date of Service: 04/02/2019  Lealand Rizk  [id number]  DOB: Jan 09, 1946   DOA: 03/22/2019  Referring Physician: Merton Border, MD  HPI: Lyn Withem is a 74 y.o. male seen for follow up of Acute on Chronic Respiratory Failure.  Patient mains capped at this time for 48 hours satting well no fever or distress.  Medications: Reviewed on Rounds  Physical Exam:  Vitals: Pulse 91 respirations 20 BP 109/65 O2 sat 96% temp 7.6  Ventilator Settings room air  . General: Comfortable at this time . Eyes: Grossly normal lids, irises & conjunctiva . ENT: grossly tongue is normal . Neck: no obvious mass . Cardiovascular: S1 S2 normal no gallop . Respiratory: No rales or rhonchi noted . Abdomen: soft . Skin: no rash seen on limited exam . Musculoskeletal: not rigid . Psychiatric:unable to assess . Neurologic: no seizure no involuntary movements         Lab Data:   Basic Metabolic Panel: Recent Labs  Lab 03/27/19 0421 03/29/19 0936 04/02/19 0419  NA 136 138 142  K 4.8 4.4 3.8  CL 97* 98 106  CO2 26 30 28   GLUCOSE 123* 147* 147*  BUN 27* 27* 17  CREATININE 1.00 1.00 0.83  CALCIUM 8.9 8.8* 8.7*  MG 2.2 2.4 1.8    ABG: No results for input(s): PHART, PCO2ART, PO2ART, HCO3, O2SAT in the last 168 hours.  Liver Function Tests: No results for input(s): AST, ALT, ALKPHOS, BILITOT, PROT, ALBUMIN in the last 168 hours. No results for input(s): LIPASE, AMYLASE in the last 168 hours. No results for input(s): AMMONIA in the last 168 hours.  CBC: Recent Labs  Lab 03/27/19 0421 03/29/19 0936 04/02/19 0419  WBC 12.1* 8.9 9.7  HGB 9.9* 8.6* 8.8*  HCT 32.6* 28.5* 29.2*  MCV 78.4* 77.2* 77.5*  PLT 297 316 315    Cardiac Enzymes: No results for input(s): CKTOTAL, CKMB, CKMBINDEX, TROPONINI in the last 168 hours.  BNP (last 3 results) No results for input(s):  BNP in the last 8760 hours.  ProBNP (last 3 results) No results for input(s): PROBNP in the last 8760 hours.  Radiological Exams: No results found.  Assessment/Plan Active Problems:   Acute on chronic respiratory failure with hypoxia (HCC)   Acute renal failure (ARF) (HCC)   Discitis, unspecified, cervical region   Severe sepsis ([employer])   Tracheostomy status ([place])   1. Acute on chronic respiratory failure with hypoxia patient will begin capping today.  2. Use the PMV reportedly secretions are still an issue 3. Acute renal failure monitoring labs 4. Discitis treated antibiotics 5. Severe sepsis we will continue supportive care 6. Tracheostomy remains in place   I have personally seen and evaluated the patient, evaluated laboratory and imaging results, formulated the assessment and plan and placed orders. The Patient requires high complexity decision making with multiple systems involvement.  Rounds were done with the Respiratory Therapy Director and Staff therapists and discussed with nursing staff also.  Allyne Gee, MD [facility] Pulmonary Critical Care Medicine Sleep Medicine

## 2019-04-03 DIAGNOSIS — N179 Acute kidney failure, unspecified: Secondary | ICD-10-CM | POA: Diagnosis not present

## 2019-04-03 DIAGNOSIS — A419 Sepsis, unspecified organism: Secondary | ICD-10-CM | POA: Diagnosis not present

## 2019-04-03 DIAGNOSIS — Z930 Tracheostomy status: Secondary | ICD-10-CM | POA: Diagnosis not present

## 2019-04-03 DIAGNOSIS — J9621 Acute and chronic respiratory failure with hypoxia: Secondary | ICD-10-CM | POA: Diagnosis not present

## 2019-04-03 LAB — VANCOMYCIN, TROUGH: Vancomycin Tr: 20 ug/mL (ref 15–20)

## 2019-04-03 MED ORDER — GENERIC EXTERNAL MEDICATION
Status: DC
Start: ? — End: 2019-04-03

## 2019-04-03 NOTE — Progress Notes (Signed)
Pulmonary Critical Care Medicine [place]   PULMONARY CRITICAL CARE SERVICE  PROGRESS NOTE  Date of Service: 04/03/2019  Jack Roberts  [id number]  DOB: 1945/04/01   DOA: 03/22/2019  Referring Physician: Merton Border, MD  HPI: Jack Roberts is a 74 y.o. male seen for follow up of Acute on Chronic Respiratory Failure.  Patient is capping today will be 48 hours  Medications: Reviewed on Rounds  Physical Exam:  Vitals: Temperature is 98.6 pulse 90 respiratory rate was 18 blood pressure 110/70 saturation 98%  Ventilator Settings capping off the ventilator  . General: Comfortable at this time . Eyes: Grossly normal lids, irises & conjunctiva . ENT: grossly tongue is normal . Neck: no obvious mass . Cardiovascular: S1 S2 normal no gallop . Respiratory: Coarse breath sounds with a few rhonchi . Abdomen: soft . Skin: no rash seen on limited exam . Musculoskeletal: not rigid . Psychiatric:unable to assess . Neurologic: no seizure no involuntary movements         Lab Data:   Basic Metabolic Panel: Recent Labs  Lab 03/29/19 0936 04/02/19 0419  NA 138 142  K 4.4 3.8  CL 98 106  CO2 30 28  GLUCOSE 147* 147*  BUN 27* 17  CREATININE 1.00 0.83  CALCIUM 8.8* 8.7*  MG 2.4 1.8    ABG: No results for input(s): PHART, PCO2ART, PO2ART, HCO3, O2SAT in the last 168 hours.  Liver Function Tests: No results for input(s): AST, ALT, ALKPHOS, BILITOT, PROT, ALBUMIN in the last 168 hours. No results for input(s): LIPASE, AMYLASE in the last 168 hours. No results for input(s): AMMONIA in the last 168 hours.  CBC: Recent Labs  Lab 03/29/19 0936 04/02/19 0419  WBC 8.9 9.7  HGB 8.6* 8.8*  HCT 28.5* 29.2*  MCV 77.2* 77.5*  PLT 316 315    Cardiac Enzymes: No results for input(s): CKTOTAL, CKMB, CKMBINDEX, TROPONINI in the last 168 hours.  BNP (last 3 results) No results for input(s): BNP in the last 8760 hours.  ProBNP (last 3 results) No results  for input(s): PROBNP in the last 8760 hours.  Radiological Exams: No results found.  Assessment/Plan Active Problems:   Acute on chronic respiratory failure with hypoxia (HCC)   Acute renal failure (ARF) (HCC)   Discitis, unspecified, cervical region   Severe sepsis ([place])   Tracheostomy status ([place])   1. Acute on chronic respiratory failure with hypoxia continue with weaning on capping trials 2. Acute renal failure following labs 3. Discitis treated 4. Severe sepsis hemodynamics are stable 5. Tracheostomy doing well with capping   I have personally seen and evaluated the patient, evaluated laboratory and imaging results, formulated the assessment and plan and placed orders. The Patient requires high complexity decision making with multiple systems involvement.  Rounds were done with the Respiratory Therapy Director and Staff therapists and discussed with nursing staff also.  Allyne Gee, MD [facility] Pulmonary Critical Care Medicine Sleep Medicine

## 2019-04-04 DIAGNOSIS — Z930 Tracheostomy status: Secondary | ICD-10-CM | POA: Diagnosis not present

## 2019-04-04 DIAGNOSIS — N179 Acute kidney failure, unspecified: Secondary | ICD-10-CM | POA: Diagnosis not present

## 2019-04-04 DIAGNOSIS — A419 Sepsis, unspecified organism: Secondary | ICD-10-CM | POA: Diagnosis not present

## 2019-04-04 DIAGNOSIS — J9621 Acute and chronic respiratory failure with hypoxia: Secondary | ICD-10-CM | POA: Diagnosis not present

## 2019-04-04 NOTE — Progress Notes (Signed)
Pulmonary Critical Care Medicine [place]   PULMONARY CRITICAL CARE SERVICE  PROGRESS NOTE  Date of Service: 04/04/2019  Jack Roberts  [id number]  DOB: 1945/02/27   DOA: 03/22/2019  Referring Physician: Merton Border, MD  HPI: Jack Roberts is a 74 y.o. male seen for follow up of Acute on Chronic Respiratory Failure.  Patient currently is doing well with capping trials has been capping now for 72 hours  Medications: Reviewed on Rounds  Physical Exam:  Vitals: Temperature is 98.1 pulse 95 respiratory 28 blood pressure is 125/82 saturations 98%  Ventilator Settings capping 72 hours  . General: Comfortable at this time . Eyes: Grossly normal lids, irises & conjunctiva . ENT: grossly tongue is normal . Neck: no obvious mass . Cardiovascular: S1 S2 normal no gallop . Respiratory: No rhonchi no rales are noted at this time . Abdomen: soft . Skin: no rash seen on limited exam . Musculoskeletal: not rigid . Psychiatric:unable to assess . Neurologic: no seizure no involuntary movements         Lab Data:   Basic Metabolic Panel: Recent Labs  Lab 03/29/19 0936 04/02/19 0419  NA 138 142  K 4.4 3.8  CL 98 106  CO2 30 28  GLUCOSE 147* 147*  BUN 27* 17  CREATININE 1.00 0.83  CALCIUM 8.8* 8.7*  MG 2.4 1.8    ABG: No results for input(s): PHART, PCO2ART, PO2ART, HCO3, O2SAT in the last 168 hours.  Liver Function Tests: No results for input(s): AST, ALT, ALKPHOS, BILITOT, PROT, ALBUMIN in the last 168 hours. No results for input(s): LIPASE, AMYLASE in the last 168 hours. No results for input(s): AMMONIA in the last 168 hours.  CBC: Recent Labs  Lab 03/29/19 0936 04/02/19 0419  WBC 8.9 9.7  HGB 8.6* 8.8*  HCT 28.5* 29.2*  MCV 77.2* 77.5*  PLT 316 315    Cardiac Enzymes: No results for input(s): CKTOTAL, CKMB, CKMBINDEX, TROPONINI in the last 168 hours.  BNP (last 3 results) No results for input(s): BNP in the last 8760 hours.  ProBNP  (last 3 results) No results for input(s): PROBNP in the last 8760 hours.  Radiological Exams: No results found.  Assessment/Plan Active Problems:   Acute on chronic respiratory failure with hypoxia (HCC)   Acute renal failure (ARF) (HCC)   Discitis, unspecified, cervical region   Severe sepsis ([place])   Tracheostomy status ([place])   1. Acute on chronic respiratory failure with hypoxia plan continue with the weaning and advance to decannulation 2. Acute renal failure monitoring labs 3. Discitis no change 4. Severe sepsis resolved 5. Tracheostomy will be removed   I have personally seen and evaluated the patient, evaluated laboratory and imaging results, formulated the assessment and plan and placed orders. The Patient requires high complexity decision making with multiple systems involvement.  Rounds were done with the Respiratory Therapy Director and Staff therapists and discussed with nursing staff also.  Allyne Gee, MD [facility] Pulmonary Critical Care Medicine Sleep Medicine

## 2019-04-05 DIAGNOSIS — Z930 Tracheostomy status: Secondary | ICD-10-CM | POA: Diagnosis not present

## 2019-04-05 DIAGNOSIS — A419 Sepsis, unspecified organism: Secondary | ICD-10-CM | POA: Diagnosis not present

## 2019-04-05 DIAGNOSIS — J9621 Acute and chronic respiratory failure with hypoxia: Secondary | ICD-10-CM | POA: Diagnosis not present

## 2019-04-05 DIAGNOSIS — N179 Acute kidney failure, unspecified: Secondary | ICD-10-CM | POA: Diagnosis not present

## 2019-04-05 LAB — BASIC METABOLIC PANEL
Anion gap: 11 (ref 5–15)
BUN: 22 mg/dL (ref 8–23)
CO2: 26 mmol/L (ref 22–32)
Calcium: 8.8 mg/dL — ABNORMAL LOW (ref 8.9–10.3)
Chloride: 105 mmol/L (ref 98–111)
Creatinine, Ser: 0.77 mg/dL (ref 0.61–1.24)
GFR calc Af Amer: >60 mL/min (ref >60)
GFR calc non Af Amer: >60 mL/min (ref >60)
Glucose, Bld: 141 mg/dL — ABNORMAL HIGH (ref 70–99)
Potassium: 3.9 mmol/L (ref 3.5–5.1)
Sodium: 142 mmol/L (ref 135–145)

## 2019-04-05 LAB — CBC
HCT: 30.1 % — ABNORMAL LOW (ref 39.0–52.0)
Hemoglobin: 9.1 g/dL — ABNORMAL LOW (ref 13.0–17.0)
MCH: 23 pg — ABNORMAL LOW (ref 26.0–34.0)
MCHC: 30.2 g/dL (ref 30.0–36.0)
MCV: 76.2 fL — ABNORMAL LOW (ref 80.0–100.0)
Platelets: 344 10*3/uL (ref 150–400)
RBC: 3.95 MIL/uL — ABNORMAL LOW (ref 4.22–5.81)
RDW: 15.9 % — ABNORMAL HIGH (ref 11.5–15.5)
WBC: 8.8 10*3/uL (ref 4.0–10.5)
nRBC: 0.3 % — ABNORMAL HIGH (ref 0.0–0.2)

## 2019-04-05 LAB — MAGNESIUM: Magnesium: 1.9 mg/dL (ref 1.7–2.4)

## 2019-04-05 MED ORDER — GENERIC EXTERNAL MEDICATION
Status: DC
Start: ? — End: 2019-04-05

## 2019-04-05 NOTE — Progress Notes (Addendum)
Pulmonary Critical Care Medicine [place]   PULMONARY CRITICAL CARE SERVICE  PROGRESS NOTE  Date of Service: 04/05/2019  Jack Roberts  [id number]  DOB: Dec 15, 1945   DOA: 03/22/2019  Referring Physician: Merton Border, MD  HPI: Jack Roberts is a 74 y.o. male seen for follow up of Acute on Chronic Respiratory Failure.  Patient was decannulated yesterday remains on room air at this time satting well with no fever or distress.  Medications: Reviewed on Rounds  Physical Exam:  Vitals: Pulse 112 respirations 33 BP 155/81 O2 sat 99% 27.9  Ventilator Settings room air  . General: Comfortable at this time . Eyes: Grossly normal lids, irises & conjunctiva . ENT: grossly tongue is normal . Neck: no obvious mass . Cardiovascular: S1 S2 normal no gallop . Respiratory: No rales or rhonchi noted . Abdomen: soft . Skin: no rash seen on limited exam . Musculoskeletal: not rigid . Psychiatric:unable to assess . Neurologic: no seizure no involuntary movements         Lab Data:   Basic Metabolic Panel: Recent Labs  Lab 04/02/19 0419 04/05/19 0534  NA 142 142  K 3.8 3.9  CL 106 105  CO2 28 26  GLUCOSE 147* 141*  BUN 17 22  CREATININE 0.83 0.77  CALCIUM 8.7* 8.8*  MG 1.8 1.9    ABG: No results for input(s): PHART, PCO2ART, PO2ART, HCO3, O2SAT in the last 168 hours.  Liver Function Tests: No results for input(s): AST, ALT, ALKPHOS, BILITOT, PROT, ALBUMIN in the last 168 hours. No results for input(s): LIPASE, AMYLASE in the last 168 hours. No results for input(s): AMMONIA in the last 168 hours.  CBC: Recent Labs  Lab 04/02/19 0419 04/05/19 0534  WBC 9.7 8.8  HGB 8.8* 9.1*  HCT 29.2* 30.1*  MCV 77.5* 76.2*  PLT 315 344    Cardiac Enzymes: No results for input(s): CKTOTAL, CKMB, CKMBINDEX, TROPONINI in the last 168 hours.  BNP (last 3 results) No results for input(s): BNP in the last 8760 hours.  ProBNP (last 3 results) No results for  input(s): PROBNP in the last 8760 hours.  Radiological Exams: No results found.  Assessment/Plan Active Problems:   Acute on chronic respiratory failure with hypoxia (HCC)   Acute renal failure (ARF) (HCC)   Discitis, unspecified, cervical region   Severe sepsis ([place])   Tracheostomy status ([place])   1. Acute on chronic respiratory failure with hypoxia patient was decannulated yesterday is doing well at this time continue supportive measures and pulmonary toilet at this time. 2. Acute renal failure monitoring labs 3. Discitis no change 4. Severe sepsis resolved 5. Tracheostomy will be removed   I have personally seen and evaluated the patient, evaluated laboratory and imaging results, formulated the assessment and plan and placed orders. The Patient requires high complexity decision making with multiple systems involvement.  Rounds were done with the Respiratory Therapy Director and Staff therapists and discussed with nursing staff also.  Allyne Gee, MD [facility] Pulmonary Critical Care Medicine Sleep Medicine

## 2019-04-06 DIAGNOSIS — J9621 Acute and chronic respiratory failure with hypoxia: Secondary | ICD-10-CM | POA: Diagnosis not present

## 2019-04-06 DIAGNOSIS — A419 Sepsis, unspecified organism: Secondary | ICD-10-CM | POA: Diagnosis not present

## 2019-04-06 DIAGNOSIS — N179 Acute kidney failure, unspecified: Secondary | ICD-10-CM | POA: Diagnosis not present

## 2019-04-06 DIAGNOSIS — Z930 Tracheostomy status: Secondary | ICD-10-CM | POA: Diagnosis not present

## 2019-04-06 NOTE — Progress Notes (Signed)
Pulmonary Critical Care Medicine [place]   PULMONARY CRITICAL CARE SERVICE  PROGRESS NOTE  Date of Service: 04/06/2019  Jack Roberts  [id number]  DOB: 1945/03/10   DOA: 03/22/2019  Referring Physician: Merton Border, MD  HPI: Jack Roberts is a 74 y.o. male seen for follow up of Acute on Chronic Respiratory Failure.  Patient at this time is on no room air has been successfully decannulated  Medications: Reviewed on Rounds  Physical Exam:  Vitals: Temperature is 99.0 pulse 79 respiratory rate 22 blood pressure is 120/69 saturations 99%  Ventilator Settings decannulated on room air  . General: Comfortable at this time . Eyes: Grossly normal lids, irises & conjunctiva . ENT: grossly tongue is normal . Neck: no obvious mass . Cardiovascular: S1 S2 normal no gallop . Respiratory: No rhonchi no rales noted at this time . Abdomen: soft . Skin: no rash seen on limited exam . Musculoskeletal: not rigid . Psychiatric:unable to assess . Neurologic: no seizure no involuntary movements         Lab Data:   Basic Metabolic Panel: Recent Labs  Lab 04/02/19 0419 04/05/19 0534  NA 142 142  K 3.8 3.9  CL 106 105  CO2 28 26  GLUCOSE 147* 141*  BUN 17 22  CREATININE 0.83 0.77  CALCIUM 8.7* 8.8*  MG 1.8 1.9    ABG: No results for input(s): PHART, PCO2ART, PO2ART, HCO3, O2SAT in the last 168 hours.  Liver Function Tests: No results for input(s): AST, ALT, ALKPHOS, BILITOT, PROT, ALBUMIN in the last 168 hours. No results for input(s): LIPASE, AMYLASE in the last 168 hours. No results for input(s): AMMONIA in the last 168 hours.  CBC: Recent Labs  Lab 04/02/19 0419 04/05/19 0534  WBC 9.7 8.8  HGB 8.8* 9.1*  HCT 29.2* 30.1*  MCV 77.5* 76.2*  PLT 315 344    Cardiac Enzymes: No results for input(s): CKTOTAL, CKMB, CKMBINDEX, TROPONINI in the last 168 hours.  BNP (last 3 results) No results for input(s): BNP in the last 8760 hours.  ProBNP  (last 3 results) No results for input(s): PROBNP in the last 8760 hours.  Radiological Exams: No results found.  Assessment/Plan Active Problems:   Acute on chronic respiratory failure with hypoxia (HCC)   Acute renal failure (ARF) (HCC)   Discitis, unspecified, cervical region   Severe sepsis ([place])   Tracheostomy status ([place])   1. Acute on chronic respiratory failure with hypoxia decannulated resolved right now we will continue to monitor 2. Acute on failure at baseline 3. Discitis treated 4. Severe sepsis 5. Tracheostomy will continue with supportive care   I have personally seen and evaluated the patient, evaluated laboratory and imaging results, formulated the assessment and plan and placed orders. The Patient requires high complexity decision making with multiple systems involvement.  Rounds were done with the Respiratory Therapy Director and Staff therapists and discussed with nursing staff also.  Allyne Gee, MD [facility] Pulmonary Critical Care Medicine Sleep Medicine

## 2019-04-06 NOTE — Progress Notes (Signed)
PROGRESS NOTE    Jack Roberts  [id number] DOB: 24-Jan-1946 DOA: 03/22/2019   Brief Narrative:  Jack Roberts is an 75 y.o. male with multiple medical problems, COPD, chronic respiratory failure, tracheostomy dependent, hypertension, diabetes mellitus, depression/PTSD who was apparently found down and admitted at the acute facility for generalized weakness.  He had an MRI and was diagnosed with discitis/osteomyelitis along with urinary tract infection.  Per infectious disease note from the outside facility UA showed greater than 100 WBC with culture mixed gram-positive flora.  CT scan showed bilateral hydroureteronephrosis without obstructing stone or mass likely secondary to bladder distention.  His Covid testing was negative.  Blood cultures did not show any growth. patient hospital course was complicated by worsening respiratory failure and required intubation and ventilation and eventually tracheostomy and PEG tube placement. Per records from acute facility MRI of the spine showed findings concerning for discitis/osteomyelitis at C3-4 with severe narrowing of the thecal sac and concern for cord compression with likely epidural phlegmon-neoplasm also on the differential. There was another level of increased focus nondiagnostic due to motion at T9-T10 with follow-up recommended if clinically indicated. 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 He underwent laminectomies at C3/4 for epidural mass considered to be phlegmon.  He received treatment with vancomycin, ceftriaxone, cefepime, meropenem, Flagyl. Intraoperative cultures reportedly did not show any growth likely because he was on antibiotics prior to surgery. He was switched to IV vancomycin, ceftriaxone per infectious disease recommendations at the outside facility.  Plan to treat for a total duration of 6 weeks.  He is currently decannulated.  He is oriented x2.   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  He was followed by pulmonary.  Patient is a longtime smoker and likely has underlying COPD.  He is currently decannulated. Currently on treatment with IV vancomycin, ceftriaxone for the C3-C4 discitis/osteomyelitis.  C3-C4 discitis/osteomyelitis: Per MRI at the outside facility he had a fluid collection with severe cord compression.  He is status post laminectomy and decompression.  Intraoperative cultures did not show any growth likely because he was already on antibiotics prior to the surgery.  Currently on treatment with IV vancomycin, ceftriaxone.  Plan to treat for total duration of 6 weeks with tentative end date 04/19/2019.  Please monitor CBC.  If he starts having any fevers, worsening leukocytosis would recommend to repeat MRI imaging to evaluate for infection/abscess. Please monitor BUN/trending closely while on antibiotics and adjust dose accordingly.   Severe sepsis: Likely secondary to urinary tract infection along with C3-C4 discitis/osteomyelitis.  Currently sepsis resolved.  However, he is at high risk for recurrent sepsis.  If he starts having any worsening fevers or worsening WBC count would recommend to send for pan cultures.  Central cord compression: The status post laminectomy and decompression.  Unfortunately remains quadriplegic.  Continue supportive management per primary team.  UTI: Patient reportedly had urinary retention with bilateral hydronephrosis likely secondary to bladder distention without any obstruction.  His UA  showed evidence of UTI but urine culture showed mixed flora.  Already treated with antibiotics at the acute facility.  Currently on antibiotics as mentioned  above for the C3-C4 discitis/osteomyelitis.  Unfortunately he is high risk for recurrent urinary tract infection.  Protein calorie malnutrition/dysphagia: Management per primary team.  Diabetes mellitus type 2: Continue to monitor Accu-Cheks, management of diabetes per the primary team.  Acute renal failure with hydronephrosis: Per records from the outside facility there was no evidence of obstruction.  The hydronephrosis was likely secondary to bladder distention.  Currently renal failure has resolved.  However, he is at high risk for recurrent renal failure.  Subjective: He is awake, oriented x2.  Afebrile.  Objective: Vitals: Temperature 98.4, pulse 79, respiratory rate 22, blood pressure 120/69, oxygen saturation 98%  Examination: General exam: Awake, oriented x2. Respiratory system: Clear to auscultation anteriorly. Respiratory effort normal. Cardiovascular system: S1 & S2, no murmur. Gastrointestinal system: Soft, nontender, plus bowel sounds Central nervous system: Alert and oriented.  Quadriplegic Extremities: Quadriplegic, no lower extremity edema Skin: No rashes Psychiatry: Mood & affect appropriate.     Data Reviewed: I have personally reviewed following labs and imaging studies  CBC: Recent Labs  Lab 04/02/19 0419 04/05/19 0534  WBC 9.7 8.8  HGB 8.8* 9.1*  HCT 29.2* 30.1*  MCV 77.5* 76.2*  PLT 315 [id number]   Basic Metabolic Panel: Recent Labs  Lab 04/02/19 0419 04/05/19 0534  NA 142 142  K 3.8 3.9  CL 106 105  CO2 28 26  GLUCOSE 147* 141*  BUN 17 22  CREATININE 0.83 0.77  CALCIUM 8.7* 8.8*  MG 1.8 1.9   GFR: CrCl cannot be calculated (Unknown ideal weight.). Liver Function Tests: No results for input(s): AST, ALT, ALKPHOS, BILITOT, PROT, ALBUMIN in the last 168 hours. No results for input(s):  LIPASE, AMYLASE in the last 168 hours. No results for input(s): AMMONIA in the last 168 hours. Coagulation Profile: No results for input(s): INR, PROTIME in the last 168 hours. Cardiac Enzymes: No results for input(s): CKTOTAL, CKMB, CKMBINDEX, TROPONINI in the last 168 hours. BNP (last 3 results) No results for input(s): PROBNP in the last 8760 hours. HbA1C: No results for input(s): HGBA1C in the last 72 hours. CBG: No results for input(s): GLUCAP in the last 168 hours. Lipid Profile: No results for input(s): CHOL, HDL, LDLCALC, TRIG, CHOLHDL, LDLDIRECT in the last 72 hours. Thyroid Function Tests: No results for input(s): TSH, T4TOTAL, FREET4, T3FREE, THYROIDAB in the last 72 hours. Anemia Panel: No results for input(s): VITAMINB12, FOLATE, FERRITIN, TIBC, IRON, RETICCTPCT in the last 72 hours. Sepsis Labs: No results for input(s): PROCALCITON, LATICACIDVEN in the last 168 hours.  No results found for this or any previous visit (from the past 240 hour(s)).       Radiology Studies: No results found.   Scheduled Meds: Please see MAR    Yaakov Guthrie, MD  04/06/2019, 4:35 PM

## 2019-04-07 LAB — VANCOMYCIN, TROUGH: Vancomycin Tr: 21 ug/mL (ref 15–20)

## 2019-04-07 MED ORDER — GENERIC EXTERNAL MEDICATION
Status: DC
Start: ? — End: 2019-04-07

## 2019-04-08 LAB — CBC
HCT: 30.4 % — ABNORMAL LOW (ref 39.0–52.0)
Hemoglobin: 9.3 g/dL — ABNORMAL LOW (ref 13.0–17.0)
MCH: 23.3 pg — ABNORMAL LOW (ref 26.0–34.0)
MCHC: 30.6 g/dL (ref 30.0–36.0)
MCV: 76.2 fL — ABNORMAL LOW (ref 80.0–100.0)
Platelets: 348 10*3/uL (ref 150–400)
RBC: 3.99 MIL/uL — ABNORMAL LOW (ref 4.22–5.81)
RDW: 15.9 % — ABNORMAL HIGH (ref 11.5–15.5)
WBC: 9.6 10*3/uL (ref 4.0–10.5)
nRBC: 0.2 % (ref 0.0–0.2)

## 2019-04-08 LAB — BASIC METABOLIC PANEL
Anion gap: 10 (ref 5–15)
BUN: 15 mg/dL (ref 8–23)
CO2: 28 mmol/L (ref 22–32)
Calcium: 8.7 mg/dL — ABNORMAL LOW (ref 8.9–10.3)
Chloride: 102 mmol/L (ref 98–111)
Creatinine, Ser: 0.79 mg/dL (ref 0.61–1.24)
GFR calc Af Amer: >60 mL/min (ref >60)
GFR calc non Af Amer: >60 mL/min (ref >60)
Glucose, Bld: 131 mg/dL — ABNORMAL HIGH (ref 70–99)
Potassium: 4.4 mmol/L (ref 3.5–5.1)
Sodium: 140 mmol/L (ref 135–145)

## 2019-04-08 LAB — MAGNESIUM: Magnesium: 2 mg/dL (ref 1.7–2.4)

## 2019-04-09 LAB — SARS CORONAVIRUS 2 (TAT 6-24 HRS): SARS Coronavirus 2: NEGATIVE

## 2019-05-03 ENCOUNTER — Encounter: Payer: Self-pay | Admitting: Infectious Disease

## 2019-05-08 ENCOUNTER — Ambulatory Visit (INDEPENDENT_AMBULATORY_CARE_PROVIDER_SITE_OTHER): Payer: Medicare HMO | Admitting: Infectious Disease

## 2019-05-08 ENCOUNTER — Encounter: Payer: Self-pay | Admitting: Infectious Disease

## 2019-05-08 ENCOUNTER — Telehealth: Payer: Self-pay | Admitting: *Deleted

## 2019-05-08 ENCOUNTER — Other Ambulatory Visit: Payer: Self-pay

## 2019-05-08 VITALS — BP 124/89 | HR 109

## 2019-05-08 DIAGNOSIS — G9341 Metabolic encephalopathy: Secondary | ICD-10-CM

## 2019-05-08 DIAGNOSIS — C61 Malignant neoplasm of prostate: Secondary | ICD-10-CM

## 2019-05-08 DIAGNOSIS — Z930 Tracheostomy status: Secondary | ICD-10-CM | POA: Diagnosis not present

## 2019-05-08 DIAGNOSIS — G8251 Quadriplegia, C1-C4 complete: Secondary | ICD-10-CM

## 2019-05-08 DIAGNOSIS — G062 Extradural and subdural abscess, unspecified: Secondary | ICD-10-CM

## 2019-05-08 DIAGNOSIS — M4642 Discitis, unspecified, cervical region: Secondary | ICD-10-CM | POA: Diagnosis not present

## 2019-05-08 HISTORY — DX: Extradural and subdural abscess, unspecified: G06.2

## 2019-05-08 MED ORDER — SULFAMETHOXAZOLE-TRIMETHOPRIM 200-40 MG/5ML PO SUSP: 20.0000 mL | Freq: Two times a day (BID) | ORAL | 0 refills | Status: AC

## 2019-05-08 NOTE — Progress Notes (Signed)
Subjective:   Reason for Consult: Patient with history of severe discitis and epidural abscess requiring laminectomy  Requesting physician: Dr. Laren Everts   Patient ID: Jack Roberts, male    DOB: September 01, 1945, 74 y.o.   MRN: [id number]  HPI  [name] is a 74 year old African-American veteran with past medical history significant for prostate cancer and PTSD who was admitted to [facility] being found down.  MRI had shown evidence of severe discitis and osteomyelitis at C3-C4 with severe narrowing of the thecal sac concern for cord compression and an epidural phlegmon is also an area in the T9-T10 as well.  He was on broad-spectrum antibiotics initially occluding vancomycin and ceftriaxone and cefepime meropenem and Flagyl.  And then underwent laminectomy to C3-C4.  Cultures were sent but no organisms grew.  He was seen by Dr. Caryn Section with infectious disease at [facility] document a 6-week course of vancomycin and ceftriaxone to and in late March.  Patient had a complicated course including respiratory failure requiring tracheostomy.  Apparently had [place] further neurological decline postoperatively which was described by his friend who accompanied him to clinic today.  Patient was subsequently transferred specialty hospital where he was an inpatient.  He was seen there by Dr. Barth Kirks recommended continuing ceftriaxone and vancomycin through April 19, 2019.  Patient seems to have been discharged on 12 March and was at a skilled nursing facility where he seems to have gotten further antibiotics the 24th.  Somehow he was scheduled with [place] to dialysis find no documentation of any conversation about referral to [place] but I believe Dr. Daneil Dolin must of contacted our clinic to arrange for appointment for him in our clinic.  He himself is not able to give much history and is very easily made anxious saying I do not know what to say yes to I do not know what to say no to.  He also perseverates about needing to go the  [facility].  He is accompanied by a woman who is a friend of his but not relative who is been apparently helping him throughout this time course.  He still has a PICC line in place those not being used he also has a PEG tube in place as he cannot eat.  Past Medical History:  Diagnosis Date  . Acute on chronic respiratory failure with hypoxia ([place])   . Acute renal failure (ARF) ([place])   . Discitis, unspecified, cervical region   . 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 Severe sepsis ([place])   . Tracheostomy status ([place])     History reviewed. No pertinent surgical history.  History reviewed. No pertinent family history.    Social History   Socioeconomic History  . Marital status: Single    Spouse name: Not on file  . Number of children: Not on file  . Years of education: Not on file  . Highest education level: Not on file  Occupational History  . Not on file  Tobacco Use  . Smoking status: Not on file  Substance and Sexual Activity  . Alcohol use: Not on file  . Drug use: Not on file  . Sexual activity: Not on file  Other Topics Concern  . Not on file  Social History Narrative  . Not on file   Social Determinants of Health   Financial Resource Strain:   . Difficulty of Paying Living Expenses:   Food Insecurity:   . Worried About Charity fundraiser in the Last Year:   . Ran  Out of Food in the Last Year:   Transportation Needs:   . Lack of Transportation (Medical):   Marland Kitchen Lack of Transportation (Non-Medical):   Physical Activity:   . Days of Exercise per Week:   . Minutes of Exercise per Session:   Stress:   . Feeling of Stress :   Social Connections:   . Frequency of Communication with Friends and Family:   . Frequency of Social Gatherings with Friends and Family:   . Attends Religious Services:   . Active Member of Clubs or Organizations:   . Attends Archivist Meetings:   Marland Kitchen Marital Status:     Not on File   Current Outpatient Medications:   .  Carboxymethylcellulose Sodium 1 % GEL, Place into both eyes 3 (three) times a day as needed., Disp: , Rfl:  .  baclofen (LIORESAL) 10 MG tablet, , Disp: , Rfl:  .  clonazePAM (KLONOPIN) 0.5 MG tablet, , Disp: , Rfl:  .  famotidine (PEPCID) 20 MG tablet, , Disp: , Rfl:  .  gabapentin (NEURONTIN) 300 MG capsule, , Disp: , Rfl:  .  heparin 5000 UNIT/ML injection, , Disp: , Rfl:  .  nitroGLYCERIN (NITROSTAT) 0.4 MG SL tablet, , Disp: , Rfl:  .  sulfamethoxazole-trimethoprim (BACTRIM) 200-40 MG/5ML suspension, Take 20 mLs by mouth 2 (two) times daily., Disp: 100 mL, Rfl: 0 .  tamsulosin (FLOMAX) 0.4 MG CAPS capsule, , Disp: , Rfl:   Review of Systems  Unable to perform ROS: Dementia       Objective:   Physical Exam HENT:     Head: Normocephalic and atraumatic.  Cardiovascular:     Rate and Rhythm: Normal rate and regular rhythm.  Pulmonary:     Effort: Pulmonary effort is normal. No respiratory distress.  Abdominal:     General: There is no distension.  Neurological:     Mental Status: He is alert. He is confused.   quadriplegic  Ileostomy site is clean.  PICC line clean dry and intact in left arm see picture May 08, 2019:           Assessment & Plan:   Severe Cervical spine infection with epidural abscess causing cord compression status post laminectomy and 6 weeks of IV ceftriaxone and vancomycin  I cannot locate the operative report to clarify if there was any hardware placed.  I would feel better regardless having him on Bactrim for at least a month so we will pull his PICC line and place him on Bactrim through his eating tube twice daily and I will see him back in roughly 1 month.  Also check sed rate CRP CBC and BMP today  Dementia: Patient appears to have significant cognitive impairment I do not know what his underlying function was prior to his hospitalization.  I would think that ultimately a palliative care consult will be helpful for this gentleman in  light of his quadriplegia and current quality of life.  PTSD this is from his time in [place].  Maybe it would beneficial to get him back into the [facility] system as well

## 2019-05-08 NOTE — Patient Instructions (Signed)
We will have PICC line DC  Start Bactrim liquid 63ml twice daily through feeding tube x 30 days w refill  RTC to see me in 6 weeks

## 2019-05-08 NOTE — Telephone Encounter (Signed)
Per verbal order from Dr Tommy Medal, 44 cm Triple Lumen Peripherally Inserted Central Catheter removed from left basilic, tip intact. No sutures present. RN unable to confirm length per chart. Dressing was clean and dry, but peeling off of the insertion point. Petroleum dressing applied. Patient disoriented, RN advised his family member no heavy lifting with this arm, leave dressing for 24 hours and call the office or seek emergent care if dressing becomes soaked with blood or swelling or sharp pain presents. Family member verbalized understanding and agreement.  Patient's questions answered to their satisfaction. Patient tolerated procedure well, EMS onsite to bring patient to SNF. Landis Gandy, RN

## 2019-05-09 LAB — CBC WITH DIFFERENTIAL/PLATELET
Absolute Monocytes: 1077 cells/uL — ABNORMAL HIGH (ref 200–950)
Basophils Absolute: 41 cells/uL (ref 0–200)
Basophils Relative: 0.5 %
Eosinophils Absolute: 251 cells/uL (ref 15–500)
Eosinophils Relative: 3.1 %
HCT: 44.6 % (ref 38.5–50.0)
Hemoglobin: 13.1 g/dL — ABNORMAL LOW (ref 13.2–17.1)
Lymphs Abs: 1701 cells/uL (ref 850–3900)
MCH: 22.4 pg — ABNORMAL LOW (ref 27.0–33.0)
MCHC: 29.4 g/dL — ABNORMAL LOW (ref 32.0–36.0)
MCV: 76.4 fL — ABNORMAL LOW (ref 80.0–100.0)
MPV: 10.8 fL (ref 7.5–12.5)
Monocytes Relative: 13.3 %
Neutro Abs: 5030 cells/uL (ref 1500–7800)
Neutrophils Relative %: 62.1 %
Platelets: 315 10*3/uL (ref 140–400)
RBC: 5.84 10*6/uL — ABNORMAL HIGH (ref 4.20–5.80)
RDW: 14 % (ref 11.0–15.0)
Total Lymphocyte: 21 %
WBC: 8.1 10*3/uL (ref 3.8–10.8)

## 2019-05-09 LAB — BASIC METABOLIC PANEL WITH GFR
BUN/Creatinine Ratio: 33 (calc) — ABNORMAL HIGH (ref 6–22)
BUN: 26 mg/dL — ABNORMAL HIGH (ref 7–25)
CO2: 31 mmol/L (ref 20–32)
Calcium: 10.1 mg/dL (ref 8.6–10.3)
Chloride: 105 mmol/L (ref 98–110)
Creat: 0.8 mg/dL (ref 0.70–1.18)
GFR, Est African American: 103 mL/min/{1.73_m2} (ref >=60)
GFR, Est Non African American: 89 mL/min/{1.73_m2} (ref >=60)
Glucose, Bld: 130 mg/dL — ABNORMAL HIGH (ref 65–99)
Potassium: 4.5 mmol/L (ref 3.5–5.3)
Sodium: 145 mmol/L (ref 135–146)

## 2019-05-09 LAB — SEDIMENTATION RATE: Sed Rate: 22 mm/h — ABNORMAL HIGH (ref 0–20)

## 2019-05-09 LAB — C-REACTIVE PROTEIN: CRP: 27.3 mg/L — ABNORMAL HIGH (ref <8.0)

## 2019-05-27 DEATH — deceased

## 2022-01-21 IMAGING — DX DG CHEST 1V PORT
1 series · 1 of 1 positions shown · non-contrast
Comparison: 03/23/2019 chest radiograph

CLINICAL DATA: Fever

EXAM:
PORTABLE CHEST 1 VIEW

[chest]
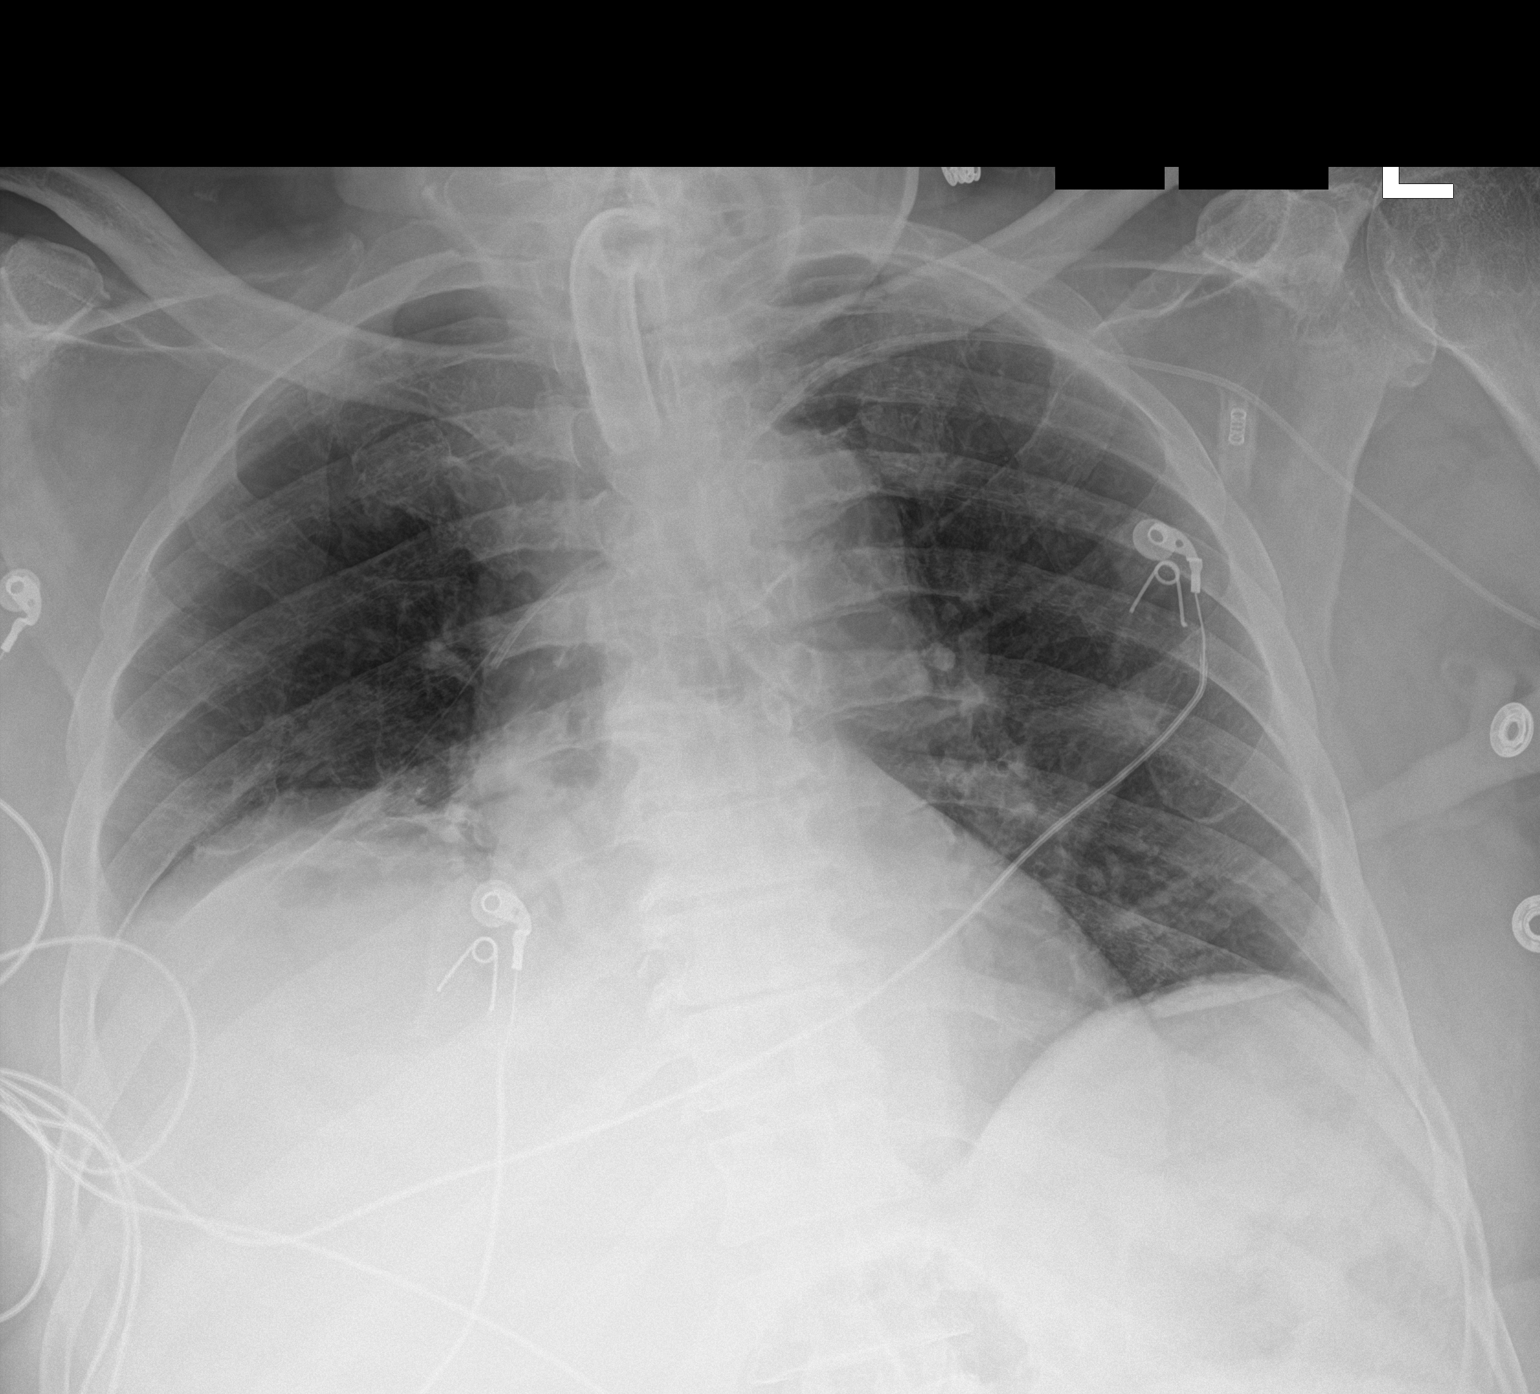

[1 of 1 positions shown; findings below may reference images not displayed]

FINDINGS: A tracheostomy tube and LEFT PICC line with tip overlying the mid
SVC noted.

RIGHT LOWER lung opacity/atelectasis is unchanged.

LEFT mid lung atelectasis again noted.

No pneumothorax or definite pleural effusion.

No acute bony abnormalities are present.
IMPRESSION: Unchanged RIGHT LOWER lung opacity/atelectasis and LEFT mid lung
atelectasis.
